# Patient Record
Sex: Female | Born: 1950 | Race: White | Hispanic: No | Marital: Single | Smoking: Never smoker
Health system: Southern US, Community
[De-identification: ages and names within clinical notes are randomized; demographics above are authoritative.]

## PROBLEM LIST (undated history)

## (undated) DIAGNOSIS — M1990 Unspecified osteoarthritis, unspecified site: Secondary | ICD-10-CM

## (undated) DIAGNOSIS — C801 Malignant (primary) neoplasm, unspecified: Secondary | ICD-10-CM

## (undated) DIAGNOSIS — G4700 Insomnia, unspecified: Secondary | ICD-10-CM

## (undated) HISTORY — PX: JOINT REPLACEMENT: SHX530

## (undated) HISTORY — DX: Insomnia, unspecified: G47.00

---

## 2016-01-08 DIAGNOSIS — I872 Venous insufficiency (chronic) (peripheral): Secondary | ICD-10-CM | POA: Diagnosis not present

## 2016-01-08 DIAGNOSIS — N390 Urinary tract infection, site not specified: Secondary | ICD-10-CM | POA: Diagnosis not present

## 2016-01-08 DIAGNOSIS — M79605 Pain in left leg: Secondary | ICD-10-CM | POA: Diagnosis not present

## 2016-01-08 DIAGNOSIS — R9431 Abnormal electrocardiogram [ECG] [EKG]: Secondary | ICD-10-CM | POA: Diagnosis not present

## 2016-01-08 DIAGNOSIS — Z01812 Encounter for preprocedural laboratory examination: Secondary | ICD-10-CM | POA: Diagnosis not present

## 2016-01-08 DIAGNOSIS — I781 Nevus, non-neoplastic: Secondary | ICD-10-CM | POA: Diagnosis not present

## 2016-01-08 DIAGNOSIS — E119 Type 2 diabetes mellitus without complications: Secondary | ICD-10-CM | POA: Diagnosis not present

## 2016-01-08 DIAGNOSIS — E782 Mixed hyperlipidemia: Secondary | ICD-10-CM | POA: Diagnosis not present

## 2016-01-08 DIAGNOSIS — Z7901 Long term (current) use of anticoagulants: Secondary | ICD-10-CM | POA: Diagnosis not present

## 2016-01-08 DIAGNOSIS — M79609 Pain in unspecified limb: Secondary | ICD-10-CM | POA: Diagnosis not present

## 2016-01-08 DIAGNOSIS — M79604 Pain in right leg: Secondary | ICD-10-CM | POA: Diagnosis not present

## 2016-01-09 DIAGNOSIS — D649 Anemia, unspecified: Secondary | ICD-10-CM | POA: Diagnosis not present

## 2016-01-09 DIAGNOSIS — M1990 Unspecified osteoarthritis, unspecified site: Secondary | ICD-10-CM | POA: Diagnosis not present

## 2016-01-09 DIAGNOSIS — F419 Anxiety disorder, unspecified: Secondary | ICD-10-CM | POA: Diagnosis not present

## 2016-01-09 DIAGNOSIS — E7800 Pure hypercholesterolemia, unspecified: Secondary | ICD-10-CM | POA: Diagnosis not present

## 2016-01-27 DIAGNOSIS — N63 Unspecified lump in breast: Secondary | ICD-10-CM | POA: Diagnosis not present

## 2016-01-27 DIAGNOSIS — Z1231 Encounter for screening mammogram for malignant neoplasm of breast: Secondary | ICD-10-CM | POA: Diagnosis not present

## 2016-02-03 DIAGNOSIS — N952 Postmenopausal atrophic vaginitis: Secondary | ICD-10-CM | POA: Diagnosis not present

## 2016-02-03 DIAGNOSIS — Z124 Encounter for screening for malignant neoplasm of cervix: Secondary | ICD-10-CM | POA: Diagnosis not present

## 2016-02-03 DIAGNOSIS — Z1151 Encounter for screening for human papillomavirus (HPV): Secondary | ICD-10-CM | POA: Diagnosis not present

## 2016-02-03 DIAGNOSIS — Z01419 Encounter for gynecological examination (general) (routine) without abnormal findings: Secondary | ICD-10-CM | POA: Diagnosis not present

## 2016-02-04 DIAGNOSIS — M25561 Pain in right knee: Secondary | ICD-10-CM | POA: Diagnosis not present

## 2016-02-04 DIAGNOSIS — M1711 Unilateral primary osteoarthritis, right knee: Secondary | ICD-10-CM | POA: Diagnosis not present

## 2016-02-04 DIAGNOSIS — Z1151 Encounter for screening for human papillomavirus (HPV): Secondary | ICD-10-CM | POA: Diagnosis not present

## 2016-02-04 DIAGNOSIS — M25562 Pain in left knee: Secondary | ICD-10-CM | POA: Diagnosis not present

## 2016-02-04 DIAGNOSIS — Z124 Encounter for screening for malignant neoplasm of cervix: Secondary | ICD-10-CM | POA: Diagnosis not present

## 2016-02-04 DIAGNOSIS — M1712 Unilateral primary osteoarthritis, left knee: Secondary | ICD-10-CM | POA: Diagnosis not present

## 2016-02-24 DIAGNOSIS — B351 Tinea unguium: Secondary | ICD-10-CM | POA: Diagnosis not present

## 2016-02-24 DIAGNOSIS — R262 Difficulty in walking, not elsewhere classified: Secondary | ICD-10-CM | POA: Diagnosis not present

## 2016-03-09 DIAGNOSIS — E7800 Pure hypercholesterolemia, unspecified: Secondary | ICD-10-CM | POA: Diagnosis not present

## 2016-03-09 DIAGNOSIS — F419 Anxiety disorder, unspecified: Secondary | ICD-10-CM | POA: Diagnosis not present

## 2016-03-09 DIAGNOSIS — M1990 Unspecified osteoarthritis, unspecified site: Secondary | ICD-10-CM | POA: Diagnosis not present

## 2016-03-09 DIAGNOSIS — R634 Abnormal weight loss: Secondary | ICD-10-CM | POA: Diagnosis not present

## 2016-03-26 DIAGNOSIS — M545 Low back pain: Secondary | ICD-10-CM | POA: Diagnosis not present

## 2016-03-26 DIAGNOSIS — M9903 Segmental and somatic dysfunction of lumbar region: Secondary | ICD-10-CM | POA: Diagnosis not present

## 2016-03-29 DIAGNOSIS — M545 Low back pain: Secondary | ICD-10-CM | POA: Diagnosis not present

## 2016-03-29 DIAGNOSIS — M9903 Segmental and somatic dysfunction of lumbar region: Secondary | ICD-10-CM | POA: Diagnosis not present

## 2016-03-31 DIAGNOSIS — M9903 Segmental and somatic dysfunction of lumbar region: Secondary | ICD-10-CM | POA: Diagnosis not present

## 2016-03-31 DIAGNOSIS — M545 Low back pain: Secondary | ICD-10-CM | POA: Diagnosis not present

## 2016-04-07 DIAGNOSIS — L821 Other seborrheic keratosis: Secondary | ICD-10-CM | POA: Diagnosis not present

## 2016-04-07 DIAGNOSIS — L820 Inflamed seborrheic keratosis: Secondary | ICD-10-CM | POA: Diagnosis not present

## 2016-04-07 DIAGNOSIS — D0422 Carcinoma in situ of skin of left ear and external auricular canal: Secondary | ICD-10-CM | POA: Diagnosis not present

## 2016-04-07 DIAGNOSIS — L814 Other melanin hyperpigmentation: Secondary | ICD-10-CM | POA: Diagnosis not present

## 2016-04-07 DIAGNOSIS — L570 Actinic keratosis: Secondary | ICD-10-CM | POA: Diagnosis not present

## 2016-04-07 DIAGNOSIS — M9903 Segmental and somatic dysfunction of lumbar region: Secondary | ICD-10-CM | POA: Diagnosis not present

## 2016-04-07 DIAGNOSIS — D485 Neoplasm of uncertain behavior of skin: Secondary | ICD-10-CM | POA: Diagnosis not present

## 2016-04-07 DIAGNOSIS — M545 Low back pain: Secondary | ICD-10-CM | POA: Diagnosis not present

## 2016-04-07 DIAGNOSIS — L578 Other skin changes due to chronic exposure to nonionizing radiation: Secondary | ICD-10-CM | POA: Diagnosis not present

## 2016-04-13 DIAGNOSIS — C44229 Squamous cell carcinoma of skin of left ear and external auricular canal: Secondary | ICD-10-CM | POA: Diagnosis not present

## 2016-04-13 DIAGNOSIS — L821 Other seborrheic keratosis: Secondary | ICD-10-CM | POA: Diagnosis not present

## 2016-04-15 DIAGNOSIS — M545 Low back pain: Secondary | ICD-10-CM | POA: Diagnosis not present

## 2016-04-15 DIAGNOSIS — M9903 Segmental and somatic dysfunction of lumbar region: Secondary | ICD-10-CM | POA: Diagnosis not present

## 2016-05-20 DIAGNOSIS — Z85828 Personal history of other malignant neoplasm of skin: Secondary | ICD-10-CM | POA: Diagnosis not present

## 2016-05-20 DIAGNOSIS — S0080XA Unspecified superficial injury of other part of head, initial encounter: Secondary | ICD-10-CM | POA: Diagnosis not present

## 2016-05-20 DIAGNOSIS — Z08 Encounter for follow-up examination after completed treatment for malignant neoplasm: Secondary | ICD-10-CM | POA: Diagnosis not present

## 2016-05-20 DIAGNOSIS — L905 Scar conditions and fibrosis of skin: Secondary | ICD-10-CM | POA: Diagnosis not present

## 2016-05-26 DIAGNOSIS — E782 Mixed hyperlipidemia: Secondary | ICD-10-CM | POA: Diagnosis not present

## 2016-05-26 DIAGNOSIS — E119 Type 2 diabetes mellitus without complications: Secondary | ICD-10-CM | POA: Diagnosis not present

## 2016-05-29 DIAGNOSIS — Z23 Encounter for immunization: Secondary | ICD-10-CM | POA: Diagnosis not present

## 2016-07-23 DIAGNOSIS — M1990 Unspecified osteoarthritis, unspecified site: Secondary | ICD-10-CM | POA: Diagnosis not present

## 2016-07-23 DIAGNOSIS — R634 Abnormal weight loss: Secondary | ICD-10-CM | POA: Diagnosis not present

## 2016-07-23 DIAGNOSIS — E7800 Pure hypercholesterolemia, unspecified: Secondary | ICD-10-CM | POA: Diagnosis not present

## 2016-07-23 DIAGNOSIS — F419 Anxiety disorder, unspecified: Secondary | ICD-10-CM | POA: Diagnosis not present

## 2016-09-02 DIAGNOSIS — D1723 Benign lipomatous neoplasm of skin and subcutaneous tissue of right leg: Secondary | ICD-10-CM | POA: Diagnosis not present

## 2016-09-02 DIAGNOSIS — C44212 Basal cell carcinoma of skin of right ear and external auricular canal: Secondary | ICD-10-CM | POA: Diagnosis not present

## 2016-09-02 DIAGNOSIS — G501 Atypical facial pain: Secondary | ICD-10-CM | POA: Diagnosis not present

## 2016-09-08 DIAGNOSIS — L723 Sebaceous cyst: Secondary | ICD-10-CM | POA: Diagnosis not present

## 2016-09-08 DIAGNOSIS — G8929 Other chronic pain: Secondary | ICD-10-CM | POA: Diagnosis not present

## 2016-09-08 DIAGNOSIS — L905 Scar conditions and fibrosis of skin: Secondary | ICD-10-CM | POA: Diagnosis not present

## 2016-09-08 DIAGNOSIS — D1723 Benign lipomatous neoplasm of skin and subcutaneous tissue of right leg: Secondary | ICD-10-CM | POA: Diagnosis not present

## 2016-09-08 DIAGNOSIS — L578 Other skin changes due to chronic exposure to nonionizing radiation: Secondary | ICD-10-CM | POA: Diagnosis not present

## 2016-09-08 DIAGNOSIS — D485 Neoplasm of uncertain behavior of skin: Secondary | ICD-10-CM | POA: Diagnosis not present

## 2016-09-08 DIAGNOSIS — Z0000 Encounter for general adult medical examination without abnormal findings: Secondary | ICD-10-CM | POA: Diagnosis not present

## 2016-09-08 DIAGNOSIS — G501 Atypical facial pain: Secondary | ICD-10-CM | POA: Diagnosis not present

## 2016-09-08 DIAGNOSIS — C44212 Basal cell carcinoma of skin of right ear and external auricular canal: Secondary | ICD-10-CM | POA: Diagnosis not present

## 2016-09-29 DIAGNOSIS — D485 Neoplasm of uncertain behavior of skin: Secondary | ICD-10-CM | POA: Diagnosis not present

## 2016-11-09 DIAGNOSIS — H01005 Unspecified blepharitis left lower eyelid: Secondary | ICD-10-CM | POA: Diagnosis not present

## 2016-11-09 DIAGNOSIS — H01004 Unspecified blepharitis left upper eyelid: Secondary | ICD-10-CM | POA: Diagnosis not present

## 2016-11-09 DIAGNOSIS — H524 Presbyopia: Secondary | ICD-10-CM | POA: Diagnosis not present

## 2016-11-09 DIAGNOSIS — H01001 Unspecified blepharitis right upper eyelid: Secondary | ICD-10-CM | POA: Diagnosis not present

## 2016-11-09 DIAGNOSIS — H01002 Unspecified blepharitis right lower eyelid: Secondary | ICD-10-CM | POA: Diagnosis not present

## 2016-12-08 DIAGNOSIS — M1711 Unilateral primary osteoarthritis, right knee: Secondary | ICD-10-CM | POA: Diagnosis not present

## 2016-12-14 DIAGNOSIS — H524 Presbyopia: Secondary | ICD-10-CM | POA: Diagnosis not present

## 2016-12-14 DIAGNOSIS — H01004 Unspecified blepharitis left upper eyelid: Secondary | ICD-10-CM | POA: Diagnosis not present

## 2016-12-14 DIAGNOSIS — H01005 Unspecified blepharitis left lower eyelid: Secondary | ICD-10-CM | POA: Diagnosis not present

## 2016-12-14 DIAGNOSIS — H01002 Unspecified blepharitis right lower eyelid: Secondary | ICD-10-CM | POA: Diagnosis not present

## 2016-12-14 DIAGNOSIS — H01001 Unspecified blepharitis right upper eyelid: Secondary | ICD-10-CM | POA: Diagnosis not present

## 2017-01-21 DIAGNOSIS — M1711 Unilateral primary osteoarthritis, right knee: Secondary | ICD-10-CM | POA: Diagnosis not present

## 2017-02-03 DIAGNOSIS — F419 Anxiety disorder, unspecified: Secondary | ICD-10-CM | POA: Diagnosis not present

## 2017-02-03 DIAGNOSIS — R262 Difficulty in walking, not elsewhere classified: Secondary | ICD-10-CM | POA: Diagnosis not present

## 2017-02-03 DIAGNOSIS — B351 Tinea unguium: Secondary | ICD-10-CM | POA: Diagnosis not present

## 2017-02-03 DIAGNOSIS — M1711 Unilateral primary osteoarthritis, right knee: Secondary | ICD-10-CM | POA: Diagnosis not present

## 2017-02-03 DIAGNOSIS — Z01818 Encounter for other preprocedural examination: Secondary | ICD-10-CM | POA: Diagnosis not present

## 2017-02-03 DIAGNOSIS — M71579 Other bursitis, not elsewhere classified, unspecified ankle and foot: Secondary | ICD-10-CM | POA: Diagnosis not present

## 2017-02-03 DIAGNOSIS — M2040 Other hammer toe(s) (acquired), unspecified foot: Secondary | ICD-10-CM | POA: Diagnosis not present

## 2017-02-07 ENCOUNTER — Ambulatory Visit: Payer: Self-pay | Admitting: Orthopedic Surgery

## 2017-02-08 DIAGNOSIS — M1711 Unilateral primary osteoarthritis, right knee: Secondary | ICD-10-CM | POA: Diagnosis not present

## 2017-02-11 NOTE — Patient Instructions (Addendum)
Veronica Ho  02/11/2017   Your procedure is scheduled on: 02/21/17  Report to [facility] Main  Entrance .  Report to admitting at   0800 AM   Call this number if you have problems the morning of surgery [phone]    Remember: ONLY 1 PERSON MAY GO WITH YOU TO SHORT STAY TO GET  READY MORNING OF YOUR SURGERY.  Do not eat food or drink liquids :After Midnight.     Take these medicines the morning of surgery with A SIP OF WATER: NONE                                You may not have any metal on your body including hair pins and              piercings  Do not wear jewelry, make-up, lotions, powders or perfumes, deodorant             Do not wear nail polish.  Do not shave  48 hours prior to surgery.                Do not bring valuables to the hospital. [place].  Contacts, dentures or bridgework may not be worn into surgery.  Leave suitcase in the car. After surgery it may be brought to your room.                  Please read over the following fact sheets you were given: _____________________________________________________________________           [facility] - Preparing for Surgery Before surgery, you can play an important role.  Because skin is not sterile, your skin needs to be as free of germs as possible.  You can reduce the number of germs on your skin by washing with CHG (chlorahexidine gluconate) soap before surgery.  CHG is an antiseptic cleaner which kills germs and bonds with the skin to continue killing germs even after washing. Please DO NOT use if you have an allergy to CHG or antibacterial soaps.  If your skin becomes reddened/irritated stop using the CHG and inform your nurse when you arrive at Short Stay. Do not shave (including legs and underarms) for at least 48 hours prior to the first CHG shower.  You may shave your face/neck. Please follow these instructions carefully:  1.  Shower  with CHG Soap the night before surgery and the  morning of Surgery.  2.  If you choose to wash your hair, wash your hair first as usual with your  normal  shampoo.  3.  After you shampoo, rinse your hair and body thoroughly to remove the  shampoo.                           4.  Use CHG as you would any other liquid soap.  You can apply chg directly  to the skin and wash                       Gently with a scrungie or clean washcloth.  5.  Apply the CHG Soap to your body ONLY FROM THE NECK DOWN.   Do not  use on face/ open                           Wound or open sores. Avoid contact with eyes, ears mouth and genitals (private parts).                       Wash face,  Genitals (private parts) with your normal soap.             6.  Wash thoroughly, paying special attention to the area where your surgery  will be performed.  7.  Thoroughly rinse your body with warm water from the neck down.  8.  DO NOT shower/wash with your normal soap after using and rinsing off  the CHG Soap.                9.  Pat yourself dry with a clean towel.            10.  Wear clean pajamas.            11.  Place clean sheets on your bed the night of your first shower and do not  sleep with pets. Day of Surgery : Do not apply any lotions/deodorants the morning of surgery.  Please wear clean clothes to the hospital/surgery center.  FAILURE TO FOLLOW THESE INSTRUCTIONS MAY RESULT IN THE CANCELLATION OF YOUR SURGERY PATIENT SIGNATURE_________________________________  NURSE SIGNATURE__________________________________  ________________________________________________________________________  WHAT IS A BLOOD TRANSFUSION? Blood Transfusion Information  A transfusion is the replacement of blood or some of its parts. Blood is made up of multiple cells which provide different functions.  Red blood cells carry oxygen and are used for blood loss replacement.  White blood cells fight against infection.  Platelets control  bleeding.  Plasma helps clot blood.  Other blood products are available for specialized needs, such as hemophilia or other clotting disorders. BEFORE THE TRANSFUSION  Who gives blood for transfusions?   Healthy volunteers who are fully evaluated to make sure their blood is safe. This is blood bank blood. Transfusion therapy is the safest it has ever been in the practice of medicine. Before blood is taken from a donor, a complete history is taken to make sure that person has no history of diseases nor engages in risky social behavior (examples are intravenous drug use or sexual activity with multiple partners). The donor's travel history is screened to minimize risk of transmitting infections, such as malaria. The donated blood is tested for signs of infectious diseases, such as HIV and hepatitis. The blood is then tested to be sure it is compatible with you in order to minimize the chance of a transfusion reaction. If you or a relative donates blood, this is often done in anticipation of surgery and is not appropriate for emergency situations. It takes many days to process the donated blood. RISKS AND COMPLICATIONS Although transfusion therapy is very safe and saves many lives, the main dangers of transfusion include:   Getting an infectious disease.  Developing a transfusion reaction. This is an allergic reaction to something in the blood you were given. Every precaution is taken to prevent this. The decision to have a blood transfusion has been considered carefully by your caregiver before blood is given. Blood is not given unless the benefits outweigh the risks. AFTER THE TRANSFUSION  Right after receiving a blood transfusion, you will usually feel much better and more energetic. This is especially true if  your red blood cells have gotten low (anemic). The transfusion raises the level of the red blood cells which carry oxygen, and this usually causes an energy increase.  The nurse  administering the transfusion will monitor you carefully for complications. HOME CARE INSTRUCTIONS  No special instructions are needed after a transfusion. You may find your energy is better. Speak with your caregiver about any limitations on activity for underlying diseases you may have. SEEK MEDICAL CARE IF:   Your condition is not improving after your transfusion.  You develop redness or irritation at the intravenous (IV) site. SEEK IMMEDIATE MEDICAL CARE IF:  Any of the following symptoms occur over the next 12 hours:  Shaking chills.  You have a temperature by mouth above 102 F (38.9 C), not controlled by medicine.  Chest, back, or muscle pain.  People around you feel you are not acting correctly or are confused.  Shortness of breath or difficulty breathing.  Dizziness and fainting.  You get a rash or develop hives.  You have a decrease in urine output.  Your urine turns a dark color or changes to pink, red, or brown. Any of the following symptoms occur over the next 10 days:  You have a temperature by mouth above 102 F (38.9 C), not controlled by medicine.  Shortness of breath.  Weakness after normal activity.  The white part of the eye turns yellow (jaundice).  You have a decrease in the amount of urine or are urinating less often.  Your urine turns a dark color or changes to pink, red, or brown. Document Released: 07/23/2000 Document Revised: 10/18/2011 Document Reviewed: 03/11/2008 ExitCare Patient Information 2014 [place].  _______________________________________________________________________  Incentive Spirometer  An incentive spirometer is a tool that can help keep your lungs clear and active. This tool measures how well you are filling your lungs with each breath. Taking long deep breaths may help reverse or decrease the chance of developing breathing (pulmonary) problems (especially infection) following:  A long period of time when you are  unable to move or be active. BEFORE THE PROCEDURE   If the spirometer includes an indicator to show your best effort, your nurse or respiratory therapist will set it to a desired goal.  If possible, sit up straight or lean slightly forward. Try not to slouch.  Hold the incentive spirometer in an upright position. INSTRUCTIONS FOR USE  1. Sit on the edge of your bed if possible, or sit up as far as you can in bed or on a chair. 2. Hold the incentive spirometer in an upright position. 3. Breathe out normally. 4. Place the mouthpiece in your mouth and seal your lips tightly around it. 5. Breathe in slowly and as deeply as possible, raising the piston or the ball toward the top of the column. 6. Hold your breath for 3-5 seconds or for as long as possible. Allow the piston or ball to fall to the bottom of the column. 7. Remove the mouthpiece from your mouth and breathe out normally. 8. Rest for a few seconds and repeat Steps 1 through 7 at least 10 times every 1-2 hours when you are awake. Take your time and take a few normal breaths between deep breaths. 9. The spirometer may include an indicator to show your best effort. Use the indicator as a goal to work toward during each repetition. 10. After each set of 10 deep breaths, practice coughing to be sure your lungs are clear. If you have an incision (  the cut made at the time of surgery), support your incision when coughing by placing a pillow or rolled up towels firmly against it. Once you are able to get out of bed, walk around indoors and cough well. You may stop using the incentive spirometer when instructed by your caregiver.  RISKS AND COMPLICATIONS  Take your time so you do not get dizzy or light-headed.  If you are in pain, you may need to take or ask for pain medication before doing incentive spirometry. It is harder to take a deep breath if you are having pain. AFTER USE  Rest and breathe slowly and easily.  It can be helpful to  keep track of a log of your progress. Your caregiver can provide you with a simple table to help with this. If you are using the spirometer at home, follow these instructions: [place] IF:   You are having difficultly using the spirometer.  You have trouble using the spirometer as often as instructed.  Your pain medication is not giving enough relief while using the spirometer.  You develop fever of 100.5 F (38.1 C) or higher. SEEK IMMEDIATE MEDICAL CARE IF:   You cough up bloody sputum that had not been present before.  You develop fever of 102 F (38.9 C) or greater.  You develop worsening pain at or near the incision site. MAKE SURE YOU:   Understand these instructions.  Will watch your condition.  Will get help right away if you are not doing well or get worse. Document Released: 12/06/2006 Document Revised: 10/18/2011 Document Reviewed: 02/06/2007 [facility] Patient Information 2014 [place], [place].   ________________________________________________________________________

## 2017-02-16 ENCOUNTER — Encounter (HOSPITAL_COMMUNITY): Payer: Self-pay

## 2017-02-16 ENCOUNTER — Encounter (HOSPITAL_COMMUNITY)
Admission: RE | Admit: 2017-02-16 | Discharge: 2017-02-16 | Disposition: A | Discharge status: Home / Self Care | Payer: Medicare Other | Source: Ambulatory Visit | Attending: Orthopedic Surgery | Admitting: Orthopedic Surgery

## 2017-02-16 DIAGNOSIS — Z01812 Encounter for preprocedural laboratory examination: Secondary | ICD-10-CM | POA: Diagnosis not present

## 2017-02-16 DIAGNOSIS — Z0183 Encounter for blood typing: Secondary | ICD-10-CM | POA: Diagnosis not present

## 2017-02-16 DIAGNOSIS — M1711 Unilateral primary osteoarthritis, right knee: Secondary | ICD-10-CM | POA: Insufficient documentation

## 2017-02-16 HISTORY — DX: Malignant (primary) neoplasm, unspecified: C80.1

## 2017-02-16 HISTORY — DX: Unspecified osteoarthritis, unspecified site: M19.90

## 2017-02-16 LAB — APTT: aPTT: 43 seconds — ABNORMAL HIGH (ref 24–36)

## 2017-02-16 LAB — CBC
HCT: 40.3 % (ref 36.0–46.0)
HEMOGLOBIN: 13.4 g/dL (ref 12.0–15.0)
MCH: 29.1 pg (ref 26.0–34.0)
MCHC: 33.3 g/dL (ref 30.0–36.0)
MCV: 87.4 fL (ref 78.0–100.0)
PLATELETS: 277 10*3/uL (ref 150–400)
RBC: 4.61 MIL/uL (ref 3.87–5.11)
RDW: 14.1 % (ref 11.5–15.5)
WBC: 7.3 10*3/uL (ref 4.0–10.5)

## 2017-02-16 LAB — COMPREHENSIVE METABOLIC PANEL
ALT: 15 U/L (ref 14–54)
AST: 26 U/L (ref 15–41)
Albumin: 4.5 g/dL (ref 3.5–5.0)
Alkaline Phosphatase: 55 U/L (ref 38–126)
Anion gap: 7 (ref 5–15)
BUN: 11 mg/dL (ref 6–20)
CHLORIDE: 107 mmol/L (ref 101–111)
CO2: 29 mmol/L (ref 22–32)
CREATININE: 0.67 mg/dL (ref 0.44–1.00)
Calcium: 9.2 mg/dL (ref 8.9–10.3)
GFR calc Af Amer: >60 mL/min (ref >60)
GFR calc non Af Amer: >60 mL/min (ref >60)
Glucose, Bld: 104 mg/dL — ABNORMAL HIGH (ref 65–99)
Potassium: 3.8 mmol/L (ref 3.5–5.1)
Sodium: 143 mmol/L (ref 135–145)
Total Bilirubin: 0.5 mg/dL (ref 0.3–1.2)
Total Protein: 7.1 g/dL (ref 6.5–8.1)

## 2017-02-16 LAB — ABO/RH: ABO/RH(D): AB POS

## 2017-02-16 LAB — SURGICAL PCR SCREEN
MRSA, PCR: NEGATIVE
STAPHYLOCOCCUS AUREUS: POSITIVE — AB

## 2017-02-16 LAB — PROTIME-INR
INR: 1.04
Prothrombin Time: 13.7 seconds (ref 11.4–15.2)

## 2017-02-21 ENCOUNTER — Inpatient Hospital Stay (HOSPITAL_COMMUNITY)
Admission: RE | Admit: 2017-02-21 | Discharge: 2017-02-23 | DRG: 470 | Disposition: A | Discharge status: Home / Self Care | Payer: Medicare Other | Source: Ambulatory Visit | Attending: Orthopedic Surgery | Admitting: Orthopedic Surgery

## 2017-02-21 ENCOUNTER — Inpatient Hospital Stay (HOSPITAL_COMMUNITY): Payer: Medicare Other | Admitting: Certified Registered Nurse Anesthetist

## 2017-02-21 ENCOUNTER — Encounter (HOSPITAL_COMMUNITY)
Admission: RE | Disposition: A | Discharge status: Home / Self Care | Payer: Self-pay | Source: Ambulatory Visit | Attending: Orthopedic Surgery

## 2017-02-21 ENCOUNTER — Encounter (HOSPITAL_COMMUNITY): Payer: Self-pay | Admitting: Certified Registered Nurse Anesthetist

## 2017-02-21 DIAGNOSIS — G8918 Other acute postprocedural pain: Secondary | ICD-10-CM | POA: Diagnosis not present

## 2017-02-21 DIAGNOSIS — Z85828 Personal history of other malignant neoplasm of skin: Secondary | ICD-10-CM | POA: Diagnosis not present

## 2017-02-21 DIAGNOSIS — M179 Osteoarthritis of knee, unspecified: Secondary | ICD-10-CM

## 2017-02-21 DIAGNOSIS — Z79899 Other long term (current) drug therapy: Secondary | ICD-10-CM | POA: Diagnosis not present

## 2017-02-21 DIAGNOSIS — M1711 Unilateral primary osteoarthritis, right knee: Secondary | ICD-10-CM | POA: Diagnosis present

## 2017-02-21 DIAGNOSIS — T8482XA Fibrosis due to internal orthopedic prosthetic devices, implants and grafts, initial encounter: Secondary | ICD-10-CM | POA: Diagnosis present

## 2017-02-21 DIAGNOSIS — M1710 Unilateral primary osteoarthritis, unspecified knee: Secondary | ICD-10-CM

## 2017-02-21 HISTORY — PX: TOTAL KNEE ARTHROPLASTY: SHX125

## 2017-02-21 LAB — TYPE AND SCREEN
ABO/RH(D): AB POS
Antibody Screen: NEGATIVE

## 2017-02-21 SURGERY — ARTHROPLASTY, KNEE, TOTAL
Anesthesia: Spinal | Site: Knee | Laterality: Right

## 2017-02-21 MED ORDER — FENTANYL CITRATE (PF) 100 MCG/2ML IJ SOLN
INTRAMUSCULAR | Status: AC
  Administered 2017-02-21: 50 ug via INTRAVENOUS
  Filled 2017-02-21: qty 2

## 2017-02-21 MED ORDER — FLEET ENEMA 7-19 GM/118ML RE ENEM: 1.0000 | ENEMA | Freq: Once | RECTAL | Status: DC | PRN

## 2017-02-21 MED ORDER — PROPOFOL 500 MG/50ML IV EMUL
INTRAVENOUS | Status: DC | PRN
  Administered 2017-02-21: 100 ug/kg/min via INTRAVENOUS

## 2017-02-21 MED ORDER — OXYCODONE HCL 5 MG PO TABS
5.0000 mg | ORAL_TABLET | ORAL | Status: DC | PRN
  Administered 2017-02-21 – 2017-02-23 (×8): 10 mg via ORAL
  Filled 2017-02-21 (×8): qty 2

## 2017-02-21 MED ORDER — MIDAZOLAM HCL 2 MG/2ML IJ SOLN
2.0000 mg | Freq: Once | INTRAMUSCULAR | Status: AC
  Administered 2017-02-21: 2 mg via INTRAVENOUS

## 2017-02-21 MED ORDER — MENTHOL 3 MG MT LOZG: 1.0000 | LOZENGE | OROMUCOSAL | Status: DC | PRN

## 2017-02-21 MED ORDER — GABAPENTIN 300 MG PO CAPS
300.0000 mg | ORAL_CAPSULE | Freq: Once | ORAL | Status: AC
  Administered 2017-02-21: 300 mg via ORAL

## 2017-02-21 MED ORDER — ONDANSETRON HCL 4 MG/2ML IJ SOLN
INTRAMUSCULAR | Status: AC
  Filled 2017-02-21: qty 2

## 2017-02-21 MED ORDER — FENTANYL CITRATE (PF) 100 MCG/2ML IJ SOLN
INTRAMUSCULAR | Status: AC
  Filled 2017-02-21: qty 2

## 2017-02-21 MED ORDER — ONDANSETRON HCL 4 MG PO TABS: 4.0000 mg | ORAL_TABLET | Freq: Four times a day (QID) | ORAL | Status: DC | PRN

## 2017-02-21 MED ORDER — ONDANSETRON HCL 4 MG/2ML IJ SOLN
4.0000 mg | Freq: Once | INTRAMUSCULAR | Status: DC | PRN
Start: 2017-02-21 — End: 2017-02-21

## 2017-02-21 MED ORDER — MEPERIDINE HCL 50 MG/ML IJ SOLN: 6.2500 mg | INTRAMUSCULAR | Status: DC | PRN

## 2017-02-21 MED ORDER — ACETAMINOPHEN 10 MG/ML IV SOLN
INTRAVENOUS | Status: AC
  Filled 2017-02-21: qty 100

## 2017-02-21 MED ORDER — DEXTROSE 5 % IV SOLN
500.0000 mg | Freq: Four times a day (QID) | INTRAVENOUS | Status: DC | PRN
  Administered 2017-02-21: 500 mg via INTRAVENOUS
  Filled 2017-02-21: qty 550

## 2017-02-21 MED ORDER — FENTANYL CITRATE (PF) 100 MCG/2ML IJ SOLN
100.0000 ug | Freq: Once | INTRAMUSCULAR | Status: AC
  Administered 2017-02-21: 50 ug via INTRAVENOUS

## 2017-02-21 MED ORDER — CHLORHEXIDINE GLUCONATE 4 % EX LIQD: 60.0000 mL | Freq: Once | CUTANEOUS | Status: DC

## 2017-02-21 MED ORDER — PHENOL 1.4 % MT LIQD: 1.0000 | OROMUCOSAL | Status: DC | PRN

## 2017-02-21 MED ORDER — PROPOFOL 10 MG/ML IV BOLUS
INTRAVENOUS | Status: AC
  Filled 2017-02-21: qty 40

## 2017-02-21 MED ORDER — DEXAMETHASONE SODIUM PHOSPHATE 10 MG/ML IJ SOLN: 10.0000 mg | Freq: Once | INTRAMUSCULAR | Status: DC

## 2017-02-21 MED ORDER — HYDROMORPHONE HCL-NACL 0.5-0.9 MG/ML-% IV SOSY
PREFILLED_SYRINGE | INTRAVENOUS | Status: AC
  Filled 2017-02-21: qty 1

## 2017-02-21 MED ORDER — ONDANSETRON HCL 4 MG/2ML IJ SOLN
4.0000 mg | Freq: Four times a day (QID) | INTRAMUSCULAR | Status: DC | PRN
  Administered 2017-02-21: 4 mg via INTRAVENOUS
  Filled 2017-02-21: qty 2

## 2017-02-21 MED ORDER — TRANEXAMIC ACID 1000 MG/10ML IV SOLN
1000.0000 mg | INTRAVENOUS | Status: AC
  Administered 2017-02-21: 1000 mg via INTRAVENOUS
  Filled 2017-02-21: qty 1100

## 2017-02-21 MED ORDER — ONDANSETRON HCL 4 MG/2ML IJ SOLN
INTRAMUSCULAR | Status: DC | PRN
  Administered 2017-02-21: 4 mg via INTRAVENOUS

## 2017-02-21 MED ORDER — ACETAMINOPHEN 650 MG RE SUPP: 650.0000 mg | Freq: Four times a day (QID) | RECTAL | Status: DC | PRN

## 2017-02-21 MED ORDER — ALPRAZOLAM 0.25 MG PO TABS
0.2500 mg | ORAL_TABLET | Freq: Every day | ORAL | Status: DC
  Administered 2017-02-21: 0.25 mg via ORAL
  Administered 2017-02-22: 23:00:00 0.5 mg via ORAL
  Administered 2017-02-22: 01:00:00 0.25 mg via ORAL
  Filled 2017-02-21: qty 1
  Filled 2017-02-21: qty 2
  Filled 2017-02-21: qty 1

## 2017-02-21 MED ORDER — METHOCARBAMOL 500 MG PO TABS
500.0000 mg | ORAL_TABLET | Freq: Four times a day (QID) | ORAL | Status: DC | PRN
  Administered 2017-02-21 – 2017-02-23 (×5): 500 mg via ORAL
  Filled 2017-02-21 (×5): qty 1

## 2017-02-21 MED ORDER — DOCUSATE SODIUM 100 MG PO CAPS
100.0000 mg | ORAL_CAPSULE | Freq: Two times a day (BID) | ORAL | Status: DC
  Administered 2017-02-21 – 2017-02-23 (×4): 100 mg via ORAL
  Filled 2017-02-21 (×4): qty 1

## 2017-02-21 MED ORDER — FENTANYL CITRATE (PF) 100 MCG/2ML IJ SOLN
INTRAMUSCULAR | Status: DC | PRN
  Administered 2017-02-21 (×2): 50 ug via INTRAVENOUS

## 2017-02-21 MED ORDER — PROPOFOL 10 MG/ML IV BOLUS
INTRAVENOUS | Status: AC
  Filled 2017-02-21: qty 20

## 2017-02-21 MED ORDER — SODIUM CHLORIDE 0.9 % IV SOLN
INTRAVENOUS | Status: DC
  Administered 2017-02-21 – 2017-02-22 (×2): via INTRAVENOUS

## 2017-02-21 MED ORDER — DEXAMETHASONE SODIUM PHOSPHATE 10 MG/ML IJ SOLN
INTRAMUSCULAR | Status: DC | PRN
  Administered 2017-02-21: 10 mg via INTRAVENOUS

## 2017-02-21 MED ORDER — MORPHINE SULFATE (PF) 4 MG/ML IV SOLN: 1.0000 mg | INTRAVENOUS | Status: DC | PRN

## 2017-02-21 MED ORDER — PHENYLEPHRINE 40 MCG/ML (10ML) SYRINGE FOR IV PUSH (FOR BLOOD PRESSURE SUPPORT)
PREFILLED_SYRINGE | INTRAVENOUS | Status: AC
  Filled 2017-02-21: qty 10

## 2017-02-21 MED ORDER — BISACODYL 10 MG RE SUPP: 10.0000 mg | Freq: Every day | RECTAL | Status: DC | PRN

## 2017-02-21 MED ORDER — GABAPENTIN 300 MG PO CAPS
ORAL_CAPSULE | ORAL | Status: AC
  Administered 2017-02-21: 300 mg via ORAL
  Filled 2017-02-21: qty 1

## 2017-02-21 MED ORDER — MIDAZOLAM HCL 5 MG/5ML IJ SOLN
INTRAMUSCULAR | Status: DC | PRN
  Administered 2017-02-21 (×2): 1 mg via INTRAVENOUS

## 2017-02-21 MED ORDER — LACTATED RINGERS IV SOLN
INTRAVENOUS | Status: DC | PRN
  Administered 2017-02-21 (×2): via INTRAVENOUS

## 2017-02-21 MED ORDER — BUPIVACAINE LIPOSOME 1.3 % IJ SUSP
20.0000 mL | Freq: Once | INTRAMUSCULAR | Status: DC
  Filled 2017-02-21: qty 20

## 2017-02-21 MED ORDER — TRAMADOL HCL 50 MG PO TABS
50.0000 mg | ORAL_TABLET | Freq: Four times a day (QID) | ORAL | Status: DC | PRN
  Administered 2017-02-22 – 2017-02-23 (×2): 100 mg via ORAL
  Filled 2017-02-21 (×2): qty 2

## 2017-02-21 MED ORDER — ACETAMINOPHEN 10 MG/ML IV SOLN
1000.0000 mg | Freq: Once | INTRAVENOUS | Status: AC
  Administered 2017-02-21: 1000 mg via INTRAVENOUS

## 2017-02-21 MED ORDER — MIDAZOLAM HCL 2 MG/2ML IJ SOLN
INTRAMUSCULAR | Status: AC
  Administered 2017-02-21: 2 mg via INTRAVENOUS
  Filled 2017-02-21: qty 2

## 2017-02-21 MED ORDER — SODIUM CHLORIDE 0.9 % IJ SOLN
INTRAMUSCULAR | Status: AC
Start: 2017-02-21 — End: 2017-02-21
  Filled 2017-02-21: qty 10

## 2017-02-21 MED ORDER — MIDAZOLAM HCL 2 MG/2ML IJ SOLN
INTRAMUSCULAR | Status: AC
  Filled 2017-02-21: qty 2

## 2017-02-21 MED ORDER — ACETAMINOPHEN 500 MG PO TABS
1000.0000 mg | ORAL_TABLET | Freq: Four times a day (QID) | ORAL | Status: AC
  Administered 2017-02-21 – 2017-02-22 (×4): 1000 mg via ORAL
  Filled 2017-02-21 (×4): qty 2

## 2017-02-21 MED ORDER — RIVAROXABAN 10 MG PO TABS
10.0000 mg | ORAL_TABLET | Freq: Every day | ORAL | Status: DC
  Administered 2017-02-22 – 2017-02-23 (×2): 10 mg via ORAL
  Filled 2017-02-21 (×2): qty 1

## 2017-02-21 MED ORDER — ACETAMINOPHEN 325 MG PO TABS
650.0000 mg | ORAL_TABLET | Freq: Four times a day (QID) | ORAL | Status: DC | PRN
  Administered 2017-02-22: 650 mg via ORAL
  Filled 2017-02-21: qty 2

## 2017-02-21 MED ORDER — DEXAMETHASONE SODIUM PHOSPHATE 10 MG/ML IJ SOLN
10.0000 mg | Freq: Once | INTRAMUSCULAR | Status: AC
  Administered 2017-02-22: 10 mg via INTRAVENOUS
  Filled 2017-02-21: qty 1

## 2017-02-21 MED ORDER — LACTATED RINGERS IV SOLN: INTRAVENOUS | Status: DC

## 2017-02-21 MED ORDER — BUPIVACAINE LIPOSOME 1.3 % IJ SUSP
INTRAMUSCULAR | Status: DC | PRN
  Administered 2017-02-21: 20 mL

## 2017-02-21 MED ORDER — BUPIVACAINE-EPINEPHRINE (PF) 0.5% -1:200000 IJ SOLN
INTRAMUSCULAR | Status: DC | PRN
  Administered 2017-02-21: 30 mL via PERINEURAL

## 2017-02-21 MED ORDER — CEFAZOLIN SODIUM-DEXTROSE 2-4 GM/100ML-% IV SOLN
INTRAVENOUS | Status: AC
  Filled 2017-02-21: qty 100

## 2017-02-21 MED ORDER — CEFAZOLIN SODIUM-DEXTROSE 2-4 GM/100ML-% IV SOLN
2.0000 g | INTRAVENOUS | Status: AC
  Administered 2017-02-21: 2 g via INTRAVENOUS

## 2017-02-21 MED ORDER — DIPHENHYDRAMINE HCL 12.5 MG/5ML PO ELIX: 12.5000 mg | ORAL_SOLUTION | ORAL | Status: DC | PRN

## 2017-02-21 MED ORDER — METOCLOPRAMIDE HCL 5 MG PO TABS: 5.0000 mg | ORAL_TABLET | Freq: Three times a day (TID) | ORAL | Status: DC | PRN

## 2017-02-21 MED ORDER — BUPIVACAINE IN DEXTROSE 0.75-8.25 % IT SOLN
INTRATHECAL | Status: DC | PRN
  Administered 2017-02-21: 1.8 mL via INTRATHECAL

## 2017-02-21 MED ORDER — CEFAZOLIN SODIUM-DEXTROSE 1-4 GM/50ML-% IV SOLN
1.0000 g | Freq: Four times a day (QID) | INTRAVENOUS | Status: AC
  Administered 2017-02-21 (×2): 1 g via INTRAVENOUS
  Filled 2017-02-21 (×2): qty 50

## 2017-02-21 MED ORDER — HYDROMORPHONE HCL-NACL 0.5-0.9 MG/ML-% IV SOSY
0.2500 mg | PREFILLED_SYRINGE | INTRAVENOUS | Status: DC | PRN
  Administered 2017-02-21: 0.25 mg via INTRAVENOUS

## 2017-02-21 MED ORDER — TRANEXAMIC ACID 1000 MG/10ML IV SOLN
1000.0000 mg | Freq: Once | INTRAVENOUS | Status: AC
  Administered 2017-02-21: 16:00:00 1000 mg via INTRAVENOUS
  Filled 2017-02-21: qty 1100

## 2017-02-21 MED ORDER — DEXAMETHASONE SODIUM PHOSPHATE 10 MG/ML IJ SOLN
INTRAMUSCULAR | Status: AC
  Filled 2017-02-21: qty 1

## 2017-02-21 MED ORDER — PHENYLEPHRINE HCL 10 MG/ML IJ SOLN
INTRAMUSCULAR | Status: DC | PRN
  Administered 2017-02-21 (×5): 40 ug via INTRAVENOUS
  Administered 2017-02-21: 80 ug via INTRAVENOUS
  Administered 2017-02-21 (×6): 40 ug via INTRAVENOUS

## 2017-02-21 MED ORDER — METOCLOPRAMIDE HCL 5 MG/ML IJ SOLN
5.0000 mg | Freq: Three times a day (TID) | INTRAMUSCULAR | Status: DC | PRN
  Filled 2017-02-21: qty 2

## 2017-02-21 MED ORDER — POLYETHYLENE GLYCOL 3350 17 G PO PACK: 17.0000 g | PACK | Freq: Every day | ORAL | Status: DC | PRN

## 2017-02-21 MED ORDER — SODIUM CHLORIDE 0.9 % IJ SOLN
INTRAMUSCULAR | Status: DC | PRN
  Administered 2017-02-21: 60 mL

## 2017-02-21 MED ORDER — SODIUM CHLORIDE 0.9 % IJ SOLN
INTRAMUSCULAR | Status: AC
Start: 2017-02-21 — End: 2017-02-21
  Filled 2017-02-21: qty 50

## 2017-02-21 SURGICAL SUPPLY — 52 items
BAG DECANTER FOR FLEXI CONT (MISCELLANEOUS) ×3 IMPLANT
BAG ZIPLOCK 12X15 (MISCELLANEOUS) ×3 IMPLANT
BANDAGE ACE 6X5 VEL STRL LF (GAUZE/BANDAGES/DRESSINGS) ×3 IMPLANT
BANDAGE ELASTIC 6 VELCRO ST LF (GAUZE/BANDAGES/DRESSINGS) ×3 IMPLANT
BLADE SAG 18X100X1.27 (BLADE) ×3 IMPLANT
BLADE SAW SGTL 11.0X1.19X90.0M (BLADE) ×3 IMPLANT
BOWL SMART MIX CTS (DISPOSABLE) ×3 IMPLANT
CAPT KNEE TOTAL 3 ATTUNE ×3 IMPLANT
CEMENT HV SMART SET (Cement) ×6 IMPLANT
CLOSURE WOUND 1/2 X4 (GAUZE/BANDAGES/DRESSINGS) ×1
COVER SURGICAL LIGHT HANDLE (MISCELLANEOUS) ×3 IMPLANT
CUFF TOURN SGL QUICK 34 (TOURNIQUET CUFF) ×2
CUFF TRNQT CYL 34X4X40X1 (TOURNIQUET CUFF) ×1 IMPLANT
DECANTER SPIKE VIAL GLASS SM (MISCELLANEOUS) ×3 IMPLANT
DRAPE U-SHAPE 47X51 STRL (DRAPES) ×3 IMPLANT
DRSG ADAPTIC 3X8 NADH LF (GAUZE/BANDAGES/DRESSINGS) ×3 IMPLANT
DRSG PAD ABDOMINAL 8X10 ST (GAUZE/BANDAGES/DRESSINGS) ×3 IMPLANT
DURAPREP 26ML APPLICATOR (WOUND CARE) ×3 IMPLANT
ELECT REM PT RETURN 15FT ADLT (MISCELLANEOUS) ×3 IMPLANT
EVACUATOR 1/8 PVC DRAIN (DRAIN) ×3 IMPLANT
GAUZE SPONGE 4X4 12PLY STRL (GAUZE/BANDAGES/DRESSINGS) ×3 IMPLANT
GLOVE BIO SURGEON STRL SZ7.5 (GLOVE) IMPLANT
GLOVE BIO SURGEON STRL SZ8 (GLOVE) ×3 IMPLANT
GLOVE BIOGEL PI IND STRL 6.5 (GLOVE) IMPLANT
GLOVE BIOGEL PI IND STRL 8 (GLOVE) ×1 IMPLANT
GLOVE BIOGEL PI INDICATOR 6.5 (GLOVE)
GLOVE BIOGEL PI INDICATOR 8 (GLOVE) ×2
GLOVE SURG SS PI 6.5 STRL IVOR (GLOVE) IMPLANT
GOWN STRL REUS W/TWL LRG LVL3 (GOWN DISPOSABLE) ×3 IMPLANT
GOWN STRL REUS W/TWL XL LVL3 (GOWN DISPOSABLE) IMPLANT
HANDPIECE INTERPULSE COAX TIP (DISPOSABLE) ×2
IMMOBILIZER KNEE 20 (SOFTGOODS) ×6 IMPLANT
IMMOBILIZER KNEE 20 THIGH 36 (SOFTGOODS) ×1 IMPLANT
MANIFOLD NEPTUNE II (INSTRUMENTS) ×3 IMPLANT
NS IRRIG 1000ML POUR BTL (IV SOLUTION) ×3 IMPLANT
PACK TOTAL KNEE CUSTOM (KITS) ×3 IMPLANT
PAD ABD 7.5X8 STRL (GAUZE/BANDAGES/DRESSINGS) ×3 IMPLANT
PADDING CAST COTTON 6X4 STRL (CAST SUPPLIES) ×3 IMPLANT
POSITIONER SURGICAL ARM (MISCELLANEOUS) ×3 IMPLANT
SET HNDPC FAN SPRY TIP SCT (DISPOSABLE) ×1 IMPLANT
STRIP CLOSURE SKIN 1/2X4 (GAUZE/BANDAGES/DRESSINGS) ×2 IMPLANT
SUT MNCRL AB 4-0 PS2 18 (SUTURE) ×3 IMPLANT
SUT STRATAFIX 0 PDS 27 VIOLET (SUTURE) ×3
SUT VIC AB 2-0 CT1 27 (SUTURE) ×6
SUT VIC AB 2-0 CT1 TAPERPNT 27 (SUTURE) ×3 IMPLANT
SUTURE STRATFX 0 PDS 27 VIOLET (SUTURE) ×1 IMPLANT
SYR 30ML LL (SYRINGE) ×6 IMPLANT
TRAY FOLEY W/METER SILVER 14FR (SET/KITS/TRAYS/PACK) ×3 IMPLANT
TRAY FOLEY W/METER SILVER 16FR (SET/KITS/TRAYS/PACK) IMPLANT
WATER STERILE IRR 1000ML POUR (IV SOLUTION) ×6 IMPLANT
WRAP KNEE MAXI GEL POST OP (GAUZE/BANDAGES/DRESSINGS) ×3 IMPLANT
YANKAUER SUCT BULB TIP 10FT TU (MISCELLANEOUS) ×3 IMPLANT

## 2017-02-21 NOTE — Anesthesia Preprocedure Evaluation (Signed)
Anesthesia Evaluation  Patient identified by MRN, date of birth, ID band Patient awake    Reviewed: Allergy & Precautions, NPO status , Patient's Chart, lab work & pertinent test results  Airway Mallampati: I  TM Distance: >3 FB Neck ROM: Full    Dental   Pulmonary    Pulmonary exam normal        Cardiovascular Normal cardiovascular exam     Neuro/Psych    GI/Hepatic   Endo/Other    Renal/GU      Musculoskeletal   Abdominal   Peds  Hematology   Anesthesia Other Findings   Reproductive/Obstetrics                             Anesthesia Physical Anesthesia Plan  ASA: II  Anesthesia Plan: Spinal   Post-op Pain Management:    Induction: Intravenous  PONV Risk Score and Plan: 2 and Ondansetron and Dexamethasone  Airway Management Planned:   Additional Equipment:   Intra-op Plan:   Post-operative Plan:   Informed Consent: I have reviewed the patients History and Physical, chart, labs and discussed the procedure including the risks, benefits and alternatives for the proposed anesthesia with the patient or authorized representative who has indicated his/her understanding and acceptance.     Plan Discussed with: CRNA and Surgeon  Anesthesia Plan Comments:         Anesthesia Quick Evaluation

## 2017-02-21 NOTE — Transfer of Care (Signed)
Immediate Anesthesia Transfer of Care Note  Patient: Veronica Ho  Procedure(s) Performed: Procedure(s) with comments: RIGHT TOTAL KNEE ARTHROPLASTY (Right) - With adductor canal block.   Patient Location: PACU  Anesthesia Type:Spinal and MAC combined with regional for post-op pain  Level of Consciousness:  sedated, patient cooperative and responds to stimulation  Airway & Oxygen Therapy:Patient Spontanous Breathing and Patient connected to face mask oxgen  Post-op Assessment:  Report given to PACU RN and Post -op Vital signs reviewed and stable  Post vital signs:  Reviewed and stable  Last Vitals:  Vitals:   02/21/17 0932 02/21/17 0945  BP:  132/71  Pulse: (!) 57 60  Resp: 12 15  Temp:      Complications: No apparent anesthesia complications

## 2017-02-21 NOTE — Anesthesia Procedure Notes (Addendum)
Anesthesia Regional Block: Adductor canal block   Pre-Anesthetic Checklist: ,, timeout performed, Correct Patient, Correct Site, Correct Laterality, Correct Procedure, Correct Position, site marked, Risks and benefits discussed,  Surgical consent,  Pre-op evaluation,  At surgeon's request and post-op pain management  Laterality: Left  Prep: chloraprep       Needles:  Injection technique: Single-shot  Needle Type: Echogenic Stimulator Needle     Needle Length: 9cm  Needle Gauge: 21     Additional Needles:   Procedures: ultrasound guided,,,,,,,,  Narrative:  Start time: 02/21/2017 9:20 AM End time: 02/21/2017 9:30 AM Injection made incrementally with aspirations every 5 mL.  Performed by: Personally  Anesthesiologist: Lillia Abed  Additional Notes: Monitors applied. Patient sedated. Sterile prep and drape,hand hygiene and sterile gloves were used. Relevant anatomy identified.Needle position confirmed.Local anesthetic injected incrementally after negative aspiration. Local anesthetic spread visualized around nerve(s). Vascular puncture avoided. No complications. Image printed for medical record.The patient tolerated the procedure well.    Lillia Abed MD

## 2017-02-21 NOTE — Op Note (Signed)
OPERATIVE REPORT-TOTAL KNEE ARTHROPLASTY   Pre-operative diagnosis- Osteoarthritis  Right knee(s)  Post-operative diagnosis- Osteoarthritis Right knee(s)   Procedure-  Right  Total Knee Arthroplasty (Depuy Attune)  Surgeon- Dione Plover. Nandika Stetzer, MD  Assistant- Arlee Muslim, PA-C   Anesthesia-  Adductor canal block and spinal  EBL-* No blood loss amount entered *   Drains Hemovac  Tourniquet time-  Total Tourniquet Time Documented: Thigh (Right) - 33 minutes Total: Thigh (Right) - 33 minutes     Complications- None  Condition-PACU - hemodynamically stable.   Brief Clinical Note  Veronica Ho is a 66 y.o. year old female with end stage OA of her right knee with progressively worsening pain and dysfunction. She has constant pain, with activity and at rest and significant functional deficits with difficulties even with ADLs. She has had extensive non-op management including analgesics, injections of cortisone, and home exercise program, but remains in significant pain with significant dysfunction.Radiographs show bone on bone arthritis medial and patellofemoral with significant varus deformity. She presents now for right Total Knee Arthroplasty.    Procedure in detail---   The patient is brought into the operating room and positioned supine on the operating table. After successful administration of  Adductor canal block and spinal,   a tourniquet is placed high on the  Right thigh(s) and the lower extremity is prepped and draped in the usual sterile fashion. Time out is performed by the operating team and then the  Right lower extremity is wrapped in Esmarch, knee flexed and the tourniquet inflated to 300 mmHg.       A midline incision is made with a ten blade through the subcutaneous tissue to the level of the extensor mechanism. A fresh blade is used to make a medial parapatellar arthrotomy. Soft tissue over the proximal medial tibia is subperiosteally elevated to the joint line  with a knife and into the semimembranosus bursa with a Cobb elevator. Soft tissue over the proximal lateral tibia is elevated with attention being paid to avoiding the patellar tendon on the tibial tubercle. The patella is everted, knee flexed 90 degrees and the ACL and PCL are removed. Findings are bone on bone medial and patellofemoral with large medial osteophytes.        The drill is used to create a starting hole in the distal femur and the canal is thoroughly irrigated with sterile saline to remove the fatty contents. The 5 degree Right  valgus alignment guide is placed into the femoral canal and the distal femoral cutting block is pinned to remove 9 mm off the distal femur. Resection is made with an oscillating saw.      The tibia is subluxed forward and the menisci are removed. The extramedullary alignment guide is placed referencing proximally at the medial aspect of the tibial tubercle and distally along the second metatarsal axis and tibial crest. The block is pinned to remove 25mm off the more deficient medial  side. Resection is made with an oscillating saw. Size 4is the most appropriate size for the tibia and the proximal tibia is prepared with the modular drill and keel punch for that size.      The femoral sizing guide is placed and size 4 is most appropriate. Rotation is marked off the epicondylar axis and confirmed by creating a rectangular flexion gap at 90 degrees. The size 4 cutting block is pinned in this rotation and the anterior, posterior and chamfer cuts are made with the oscillating saw. The intercondylar block is  then placed and that cut is made.      Trial size 4 tibial component, trial size 4 posterior stabilized femur and a 7  mm posterior stabilized rotating platform insert trial is placed. Full extension is achieved with excellent varus/valgus and anterior/posterior balance throughout full range of motion. The patella is everted and thickness measured to be 21  mm. Free hand  resection is taken to 12 mm, a 35 template is placed, lug holes are drilled, trial patella is placed, and it tracks normally. Osteophytes are removed off the posterior femur with the trial in place. All trials are removed and the cut bone surfaces prepared with pulsatile lavage. Cement is mixed and once ready for implantation, the size 4 tibial implant, size  4 posterior stabilized femoral component, and the size 35 patella are cemented in place and the patella is held with the clamp. The trial insert is placed and the knee held in full extension. The Exparel (20 ml mixed with 60 ml saline) is injected into the extensor mechanism, posterior capsule, medial and lateral gutters and subcutaneous tissues.  All extruded cement is removed and once the cement is hard the permanent 7 mm posterior stabilized rotating platform insert is placed into the tibial tray.      The wound is copiously irrigated with saline solution and the extensor mechanism closed over a hemovac drain with #1 V-loc suture. The tourniquet is released for a total tourniquet time of 33  minutes. Flexion against gravity is 140 degrees and the patella tracks normally. Subcutaneous tissue is closed with 2.0 vicryl and subcuticular with running 4.0 Monocryl. The incision is cleaned and dried and steri-strips and a bulky sterile dressing are applied. The limb is placed into a knee immobilizer and the patient is awakened and transported to recovery in stable condition.      Please note that a surgical assistant was a medical necessity for this procedure in order to perform it in a safe and expeditious manner. Surgical assistant was necessary to retract the ligaments and vital neurovascular structures to prevent injury to them and also necessary for proper positioning of the limb to allow for anatomic placement of the prosthesis.   Dione Plover Adarryl Goldammer, MD    02/21/2017, 11:20 AM

## 2017-02-21 NOTE — Anesthesia Preprocedure Evaluation (Signed)
Anesthesia Evaluation Anesthesia Physical Anesthesia Plan Anesthesia Quick Evaluation  

## 2017-02-21 NOTE — Interval H&P Note (Signed)
History and Physical Interval Note:  02/21/2017 9:27 AM  Veronica Ho  has presented today for surgery, with the diagnosis of Osteoarthritis Right knee  The various methods of treatment have been discussed with the patient and family. After consideration of risks, benefits and other options for treatment, the patient has consented to  Procedure(s): RIGHT TOTAL KNEE ARTHROPLASTY (Right) as a surgical intervention .  The patient's history has been reviewed, patient examined, no change in status, stable for surgery.  I have reviewed the patient's chart and labs.  Questions were answered to the patient's satisfaction.     [name]

## 2017-02-21 NOTE — Discharge Instructions (Addendum)
° °Dr. Newman Waren °Total Joint Specialist °[facility] °[address] °, [place] [address] °[phone] ° °TOTAL KNEE REPLACEMENT POSTOPERATIVE DIRECTIONS ° °Knee Rehabilitation, Guidelines Following Surgery  °Results after knee surgery are often greatly improved when you follow the exercise, range of motion and muscle strengthening exercises prescribed by your doctor. Safety measures are also important to protect the knee from further injury. Any time any of these exercises cause you to have increased pain or swelling in your knee joint, decrease the amount until you are comfortable again and slowly increase them. If you have problems or questions, call your caregiver or physical therapist for advice.  ° °HOME CARE INSTRUCTIONS  °Remove items at home which could result in a fall. This includes throw rugs or furniture in walking pathways.  °· ICE to the affected knee every three hours for 30 minutes at a time and then as needed for pain and swelling.  Continue to use ice on the knee for pain and swelling from surgery. You may notice swelling that will progress down to the foot and ankle.  This is normal after surgery.  Elevate the leg when you are not up walking on it.   °· Continue to use the breathing machine which will help keep your temperature down.  It is common for your temperature to cycle up and down following surgery, especially at night when you are not up moving around and exerting yourself.  The breathing machine keeps your lungs expanded and your temperature down. °· Do not place pillow under knee, focus on keeping the knee straight while resting ° °DIET °You may resume your previous home diet once your are discharged from the hospital. ° °DRESSING / WOUND CARE / SHOWERING °You may start showering once you are discharged home but do not submerge the incision under water. Just pat the incision dry and apply a dry gauze dressing on daily. °Change the surgical dressing  daily and reapply a dry dressing each time. ° °ACTIVITY °Walk with your walker as instructed. °Use walker as long as suggested by your caregivers. °Avoid periods of inactivity such as sitting longer than an hour when not asleep. This helps prevent blood clots.  °You may resume a sexual relationship in one month or when given the OK by your doctor.  °You may return to work once you are cleared by your doctor.  °Do not drive a car for 6 weeks or until released by you surgeon.  °Do not drive while taking narcotics. ° °WEIGHT BEARING °Weight bearing as tolerated with assist device (walker, cane, etc) as directed, use it as long as suggested by your surgeon or therapist, typically at least 4-6 weeks. ° °POSTOPERATIVE CONSTIPATION PROTOCOL °Constipation - defined medically as fewer than three stools per week and severe constipation as less than one stool per week. ° °One of the most common issues patients have following surgery is constipation.  Even if you have a regular bowel pattern at home, your normal regimen is likely to be disrupted due to multiple reasons following surgery.  Combination of anesthesia, postoperative narcotics, change in appetite and fluid intake all can affect your bowels.  In order to avoid complications following surgery, here are some recommendations in order to help you during your recovery period. ° °Colace (docusate) - Pick up an over-the-counter form of Colace or another stool softener and take twice a day as long as you are requiring postoperative pain medications.  Take with a full glass of water   daily.  If you experience loose stools or diarrhea, hold the colace until you stool forms back up.  If your symptoms do not get better within 1 week or if they get worse, check with your doctor. ° °Dulcolax (bisacodyl) - Pick up over-the-counter and take as directed by the product packaging as needed to assist with the movement of your bowels.  Take with a full glass of water.  Use this product as  needed if not relieved by Colace only.  ° °MiraLax (polyethylene glycol) - Pick up over-the-counter to have on hand.  MiraLax is a solution that will increase the amount of water in your bowels to assist with bowel movements.  Take as directed and can mix with a glass of water, juice, soda, coffee, or tea.  Take if you go more than two days without a movement. °Do not use MiraLax more than once per day. Call your doctor if you are still constipated or irregular after using this medication for 7 days in a row. ° °If you continue to have problems with postoperative constipation, please contact the office for further assistance and recommendations.  If you experience "the worst abdominal pain ever" or develop nausea or vomiting, please contact the office immediatly for further recommendations for treatment. ° °ITCHING ° If you experience itching with your medications, try taking only a single pain pill, or even half a pain pill at a time.  You can also use Benadryl over the counter for itching or also to help with sleep.  ° °TED HOSE STOCKINGS °Wear the elastic stockings on both legs for three weeks following surgery during the day but you may remove then at night for sleeping. ° °MEDICATIONS °See your medication summary on the “After Visit Summary” that the nursing staff will review with you prior to discharge.  You may have some home medications which will be placed on hold until you complete the course of blood thinner medication.  It is important for you to complete the blood thinner medication as prescribed by your surgeon.  Continue your approved medications as instructed at time of discharge. ° °PRECAUTIONS °If you experience chest pain or shortness of breath - call 911 immediately for transfer to the hospital emergency department.  °If you develop a fever greater that 101 F, purulent drainage from wound, increased redness or drainage from wound, foul odor from the wound/dressing, or calf pain - CONTACT YOUR  SURGEON.   °                                                °FOLLOW-UP APPOINTMENTS °Make sure you keep all of your appointments after your operation with your surgeon and caregivers. You should call the office at the above phone number and make an appointment for approximately two weeks after the date of your surgery or on the date instructed by your surgeon outlined in the "After Visit Summary". ° ° °RANGE OF MOTION AND STRENGTHENING EXERCISES  °Rehabilitation of the knee is important following a knee injury or an operation. After just a few days of immobilization, the muscles of the thigh which control the knee become weakened and shrink (atrophy). Knee exercises are designed to build up the tone and strength of the thigh muscles and to improve knee motion. Often times heat used for twenty to thirty minutes before working out will loosen   up your tissues and help with improving the range of motion but do not use heat for the first two weeks following surgery. These exercises can be done on a training (exercise) mat, on the floor, on a table or on a bed. Use what ever works the best and is most comfortable for you Knee exercises include:  °Leg Lifts - While your knee is still immobilized in a splint or cast, you can do straight leg raises. Lift the leg to 60 degrees, hold for 3 sec, and slowly lower the leg. Repeat 10-20 times 2-3 times daily. Perform this exercise against resistance later as your knee gets better.  °Quad and Hamstring Sets - Tighten up the muscle on the front of the thigh (Quad) and hold for 5-10 sec. Repeat this 10-20 times hourly. Hamstring sets are done by pushing the foot backward against an object and holding for 5-10 sec. Repeat as with quad sets.  °· Leg Slides: Lying on your back, slowly slide your foot toward your buttocks, bending your knee up off the floor (only go as far as is comfortable). Then slowly slide your foot back down until your leg is flat on the floor again. °· Angel Wings:  Lying on your back spread your legs to the side as far apart as you can without causing discomfort.  °A rehabilitation program following serious knee injuries can speed recovery and prevent re-injury in the future due to weakened muscles. Contact your doctor or a physical therapist for more information on knee rehabilitation.  ° °IF YOU ARE TRANSFERRED TO A SKILLED REHAB FACILITY °If the patient is transferred to a skilled rehab facility following release from the hospital, a list of the current medications will be sent to the facility for the patient to continue.  When discharged from the skilled rehab facility, please have the facility set up the patient's Home Health Physical Therapy prior to being released. Also, the skilled facility will be responsible for providing the patient with their medications at time of release from the facility to include their pain medication, the muscle relaxants, and their blood thinner medication. If the patient is still at the rehab facility at time of the two week follow up appointment, the skilled rehab facility will also need to assist the patient in arranging follow up appointment in our office and any transportation needs. ° °MAKE SURE YOU:  °Understand these instructions.  °Get help right away if you are not doing well or get worse.  ° ° °Pick up stool softner and laxative for home use following surgery while on pain medications. °Do not submerge incision under water. °Please use good hand washing techniques while changing dressing each day. °May shower starting three days after surgery. °Please use a clean towel to pat the incision dry following showers. °Continue to use ice for pain and swelling after surgery. °Do not use any lotions or creams on the incision until instructed by your surgeon. ° ° °Information on my medicine - XARELTO® (Rivaroxaban) ° °Why was Xarelto® prescribed for you? °Xarelto® was prescribed for you to reduce the risk of blood clots forming after  orthopedic surgery. The medical term for these abnormal blood clots is venous thromboembolism (VTE). ° °What do you need to know about xarelto® ? °Take your Xarelto® ONCE DAILY at the same time every day. °You may take it either with or without food. ° °If you have difficulty swallowing the tablet whole, you may crush it and mix in applesauce just prior to   taking your dose. ° °Take Xarelto® exactly as prescribed by your doctor and DO NOT stop taking Xarelto® without talking to the doctor who prescribed the medication.  Stopping without other VTE prevention medication to take the place of Xarelto® may increase your risk of developing a clot. ° °After discharge, you should have regular check-up appointments with your healthcare provider that is prescribing your Xarelto®.   ° °What do you do if you miss a dose? °If you miss a dose, take it as soon as you remember on the same day then continue your regularly scheduled once daily regimen the next day. Do not take two doses of Xarelto® on the same day.  ° °Important Safety Information °A possible side effect of Xarelto® is bleeding. You should call your healthcare provider right away if you experience any of the following: °? Bleeding from an injury or your nose that does not stop. °? Unusual colored urine (red or dark brown) or unusual colored stools (red or black). °? Unusual bruising for unknown reasons. °? A serious fall or if you hit your head (even if there is no bleeding). ° °Some medicines may interact with Xarelto® and might increase your risk of bleeding while on Xarelto®. To help avoid this, consult your healthcare provider or pharmacist prior to using any new prescription or non-prescription medications, including herbals, vitamins, non-steroidal anti-inflammatory drugs (NSAIDs) and supplements. ° °This website has more information on Xarelto®: www.xarelto.com. °

## 2017-02-21 NOTE — H&P (Signed)
TOTAL KNEE ADMISSION H&P  Patient is being admitted for right total knee arthroplasty.  Subjective:  Chief Complaint:right knee pain.  HPI: Veronica Ho, 66 y.o. female, has a history of pain and functional disability in the right knee due to arthritis and has failed non-surgical conservative treatments for greater than 12 weeks to includeNSAID's and/or analgesics, corticosteriod injections, flexibility and strengthening excercises and activity modification.  Onset of symptoms was gradual, starting >10 years ago with gradually worsening course since that time. The patient noted no past surgery on the right knee(s).  Patient currently rates pain in the right knee(s) at 8 out of 10 with activity. Patient has night pain, worsening of pain with activity and weight bearing, pain that interferes with activities of daily living, pain with passive range of motion, crepitus and joint swelling.  Patient has evidence of periarticular osteophytes and joint space narrowing by imaging studies. There is no active infection.  Patient Active Problem List   Diagnosis Date Noted  . OA (osteoarthritis) of knee 02/21/2017   Past Medical History:  Diagnosis Date  . Arthritis   . Cancer ([place])    basal cell skin ca    Past Surgical History:  Procedure Laterality Date  . JOINT REPLACEMENT     Left knee 2015 in [place] [facility]     Prescriptions Prior to Admission  Medication Sig Dispense Refill Last Dose  . acetaminophen (TYLENOL) 325 MG tablet Take 325 mg by mouth daily.     Marland Kitchen acetaminophen (TYLENOL) 500 MG tablet Take 500-1,000 mg by mouth every 6 (six) hours as needed (for pain.).     Marland Kitchen ALPRAZolam (XANAX) 0.5 MG tablet Take 0.25-0.5 mg by mouth at bedtime.     . Biotin 2500 MCG CAPS Take 2,500 mcg by mouth daily.     . ferrous sulfate 325 (65 FE) MG tablet Take 325 mg by mouth once a week.     . Multiple Vitamin (MULTIVITAMIN WITH MINERALS) TABS tablet Take 1 tablet by mouth daily. RAINBOW  LIGHT MULTIVITAMIN     . Riboflavin (VITAMIN B-2 PO) Take 1 tablet by mouth daily.      No Known Allergies  Social History  Substance Use Topics  . Smoking status: Never Smoker  . Smokeless tobacco: Never Used  . Alcohol use No      Review of Systems  Constitutional: Negative.   HENT: Negative.   Eyes: Negative.   Respiratory: Negative.   Cardiovascular: Negative.   Gastrointestinal: Negative.   Genitourinary: Negative.   Musculoskeletal: Positive for joint pain and myalgias. Negative for back pain, falls and neck pain.  Skin: Negative.   Neurological: Negative.   Endo/Heme/Allergies: Negative.   Psychiatric/Behavioral: Negative for depression, hallucinations, memory loss, substance abuse and suicidal ideas. The patient is nervous/anxious. The patient does not have insomnia.     Objective:  Physical Exam  Constitutional: She is oriented to person, place, and time. She appears well-developed and well-nourished. No distress.  HENT:  Head: Normocephalic and atraumatic.  Right Ear: External ear normal.  Left Ear: External ear normal.  Nose: Nose normal.  Mouth/Throat: Oropharynx is clear and moist.  Eyes: Conjunctivae and EOM are normal.  Neck: Normal range of motion. Neck supple.  Cardiovascular: Normal rate, regular rhythm, normal heart sounds and intact distal pulses.   No murmur heard. Respiratory: Effort normal and breath sounds normal. No respiratory distress. She has no wheezes.  GI: Soft. Bowel sounds are normal. She exhibits no distension. There is no  tenderness.  Musculoskeletal:  Her hips show normal range of motion with no discomfort. Her right knee shows no effusion. There is a varus deformity present. Range of motion is 5 to 125. There is marked crepitus on range of motion with tenderness medial greater than lateral, and no instability noted. Left knee, no swelling, range 0 to 125 with no tenderness or instability.   Neurological: She is alert and oriented to  person, place, and time. She has normal strength and normal reflexes. No sensory deficit.  Skin: No rash noted. She is not diaphoretic. No erythema.  Psychiatric: She has a normal mood and affect. Her behavior is normal.    Vital signs in last 24 hours: Temp:  [98.4 F (36.9 C)] 98.4 F (36.9 C) (07/16 0831) Pulse Rate:  [52] 52 (07/16 0831) Resp:  [18] 18 (07/16 0831) BP: (139)/(84) 139/84 (07/16 0831) SpO2:  [98 %] 98 % (07/16 0831)  Labs:   Estimated body mass index is 19.39 kg/m as calculated from the following:   Height as of 02/16/17: 5\' 2"  (1.575 m).   Weight as of 02/16/17: 48.1 kg (106 lb).   Imaging Review Plain radiographs demonstrate severe degenerative joint disease of the right knee(s). The overall alignment ismild varus. The bone quality appears to be good for age and reported activity level.  Assessment/Plan:  End stage primary osteoarthritis, right knee   The patient history, physical examination, clinical judgment of the provider and imaging studies are consistent with end stage degenerative joint disease of the right knee(s) and total knee arthroplasty is deemed medically necessary. The treatment options including medical management, injection therapy arthroscopy and arthroplasty were discussed at length. The risks and benefits of total knee arthroplasty were presented and reviewed. The risks due to aseptic loosening, infection, stiffness, patella tracking problems, thromboembolic complications and other imponderables were discussed. The patient acknowledged the explanation, agreed to proceed with the plan and consent was signed. Patient is being admitted for inpatient treatment for surgery, pain control, PT, OT, prophylactic antibiotics, VTE prophylaxis, progressive ambulation and ADL's and discharge planning. The patient is planning to be discharged with direct outpatient therapy   PCP: Dr. Lindaann Pascal Therapy Plans: outpatient PT DME: given Rx for walker and  3-n-1 Home alone Other concerns: Reports that she needs "low/no sodium IV"; high anxiety TXA IV    Ardeen Jourdain, PA-C

## 2017-02-21 NOTE — Progress Notes (Signed)
AssistedDr. Ossey with right, ultrasound guided, adductor canal block. Side rails up, monitors on throughout procedure. See vital signs in flow sheet. Tolerated Procedure well.  

## 2017-02-21 NOTE — Progress Notes (Signed)
PT Cancellation Note  Patient Details Name: Veronica Ho MRN: [id number] DOB: 09-Mar-1951   Cancelled Treatment:    Reason Eval/Treat Not Completed: Medical issues which prohibited therapy RN reports pt with nausea and vertigo, blurred vision currently.     [name],[name] 02/21/2017, 4:24 PM [name], PT, DPT 02/21/2017 Pager: [phone]

## 2017-02-21 NOTE — Anesthesia Procedure Notes (Signed)
Spinal  Patient location during procedure: OR Start time: 02/21/2017 10:17 AM End time: 02/21/2017 10:20 AM Staffing Anesthesiologist: Lillia Abed Performed: anesthesiologist  Preanesthetic Checklist Completed: patient identified, surgical consent, pre-op evaluation, timeout performed, IV checked, risks and benefits discussed and monitors and equipment checked Spinal Block Patient position: sitting Prep: DuraPrep Patient monitoring: heart rate, cardiac monitor, continuous pulse ox and blood pressure Approach: right paramedian Location: L3-4 Injection technique: single-shot Needle Needle type: Pencan  Needle gauge: 24 G Needle length: 9 cm Needle insertion depth: 5 cm

## 2017-02-21 NOTE — Anesthesia Postprocedure Evaluation (Signed)
Anesthesia Post Note  Patient: Veronica Ho  Procedure(s) Performed: Procedure(s) (LRB): RIGHT TOTAL KNEE ARTHROPLASTY (Right)     Patient location during evaluation: PACU Anesthesia Type: Spinal Level of consciousness: oriented and awake and alert Pain management: pain level controlled Vital Signs Assessment: post-procedure vital signs reviewed and stable Respiratory status: spontaneous breathing, respiratory function stable and patient connected to nasal cannula oxygen Cardiovascular status: blood pressure returned to baseline and stable Postop Assessment: no headache and no backache Anesthetic complications: no    Last Vitals:  Vitals:   02/21/17 1245 02/21/17 1301  BP:  132/67  Pulse: (!) 55   Resp: 13 14  Temp: (!) 36.3 C (!) 36.3 C    Last Pain:  Vitals:   02/21/17 1230  TempSrc:   PainSc: 3                  Ferrah Panagopoulos DAVID

## 2017-02-22 LAB — BASIC METABOLIC PANEL
ANION GAP: 8 (ref 5–15)
BUN: 13 mg/dL (ref 6–20)
CO2: 25 mmol/L (ref 22–32)
CREATININE: 0.69 mg/dL (ref 0.44–1.00)
Calcium: 8.9 mg/dL (ref 8.9–10.3)
Chloride: 109 mmol/L (ref 101–111)
GFR calc Af Amer: >60 mL/min (ref >60)
GLUCOSE: 120 mg/dL — AB (ref 65–99)
Potassium: 4.6 mmol/L (ref 3.5–5.1)
Sodium: 142 mmol/L (ref 135–145)

## 2017-02-22 LAB — CBC
HEMATOCRIT: 35.1 % — AB (ref 36.0–46.0)
Hemoglobin: 11.4 g/dL — ABNORMAL LOW (ref 12.0–15.0)
MCH: 28.7 pg (ref 26.0–34.0)
MCHC: 32.5 g/dL (ref 30.0–36.0)
MCV: 88.4 fL (ref 78.0–100.0)
PLATELETS: 255 10*3/uL (ref 150–400)
RBC: 3.97 MIL/uL (ref 3.87–5.11)
RDW: 14 % (ref 11.5–15.5)
WBC: 13.4 10*3/uL — AB (ref 4.0–10.5)

## 2017-02-22 MED ORDER — TRAMADOL HCL 50 MG PO TABS: 50.0000 mg | ORAL_TABLET | Freq: Four times a day (QID) | ORAL | 0 refills | Status: DC | PRN

## 2017-02-22 MED ORDER — OXYCODONE HCL 5 MG PO TABS: 5.0000 mg | ORAL_TABLET | ORAL | 0 refills | Status: DC | PRN

## 2017-02-22 MED ORDER — TIZANIDINE HCL 4 MG PO TABS: 4.0000 mg | ORAL_TABLET | Freq: Three times a day (TID) | ORAL | 0 refills | Status: DC | PRN

## 2017-02-22 MED ORDER — SODIUM CHLORIDE 0.9 % IV BOLUS (SEPSIS)
250.0000 mL | Freq: Once | INTRAVENOUS | Status: AC
  Administered 2017-02-22: 12:00:00 250 mL via INTRAVENOUS

## 2017-02-22 MED ORDER — RIVAROXABAN 10 MG PO TABS: 10.0000 mg | ORAL_TABLET | Freq: Every day | ORAL | 0 refills | Status: DC

## 2017-02-22 NOTE — Progress Notes (Signed)
   Subjective: 1 Day Post-Op Procedure(s) (LRB): RIGHT TOTAL KNEE ARTHROPLASTY (Right) Patient reports pain as mild.   We will start therapy today.  Plan is to go Home after hospital stay.  Objective: Vital signs in last 24 hours: Temp:  [97.4 F (36.3 C)-98 F (36.7 C)] 97.9 F (36.6 C) (07/17 1005) Pulse Rate:  [46-76] 48 (07/17 1005) Resp:  [12-18] 15 (07/17 1005) BP: (95-135)/(44-83) 126/61 (07/17 1005) SpO2:  [95 %-100 %] 95 % (07/17 1005)  Intake/Output from previous day:  Intake/Output Summary (Last 24 hours) at 02/22/17 1015 Last data filed at 02/22/17 1000  Gross per 24 hour  Intake             3250 ml  Output             3225 ml  Net               25 ml    Intake/Output this shift: Total I/O In: 420 [P.O.:120; I.V.:300] Out: 575 [Urine:575]  Labs:  Recent Labs  02/22/17 0551  HGB 11.4*    Recent Labs  02/22/17 0551  WBC 13.4*  RBC 3.97  HCT 35.1*  PLT 255    Recent Labs  02/22/17 0551  NA 142  K 4.6  CL 109  CO2 25  BUN 13  CREATININE 0.69  GLUCOSE 120*  CALCIUM 8.9   No results for input(s): LABPT, INR in the last 72 hours.  EXAM General - Patient is Alert, Appropriate and Oriented Extremity - Neurologically intact Neurovascular intact No cellulitis present Compartment soft Dressing - dressing C/D/I Motor Function - intact, moving foot and toes well on exam.  Hemovac pulled without difficulty. BP low this AM but asymptomatic  Past Medical History:  Diagnosis Date  . Arthritis   . Cancer ([place])    basal cell skin ca    Assessment/Plan: 1 Day Post-Op Procedure(s) (LRB): RIGHT TOTAL KNEE ARTHROPLASTY (Right) Principal Problem:   OA (osteoarthritis) of knee   Advance diet Up with therapy D/C IV fluids Plan for discharge tomorrow  DVT Prophylaxis - Xarelto Weight-Bearing as tolerated to right leg   Jarone Ostergaard V 02/22/2017, 10:15 AM

## 2017-02-22 NOTE — Evaluation (Signed)
Physical Therapy Evaluation Patient Details Name: Veronica Ho MRN: [id number] DOB: Dec 09, 1950 Today's Date: 02/22/2017   History of Present Illness  (P) s/p R TKA  Clinical Impression  Pt is s/p TKA resulting in the deficits listed below (see PT Problem List). Pt will benefit from skilled PT to increase their independence and safety with mobility to allow discharge to the venue listed below.  Pt willing to work with PT despite pain level 10/10;     Follow Up Recommendations DC plan and follow up therapy as arranged by surgeon (VERA)    Equipment Recommendations  None recommended by PT    Recommendations for Other Services       Precautions / Restrictions Precautions Precautions: (P) Fall Precaution Comments: (P) IND SLRs--KI not used Required Braces or Orthoses: (P) Knee Immobilizer - Right Knee Immobilizer - Right: (P) Discontinue once straight leg raise with < 10 degree lag Restrictions Weight Bearing Restrictions: (P) No RLE Weight Bearing: (P) Weight bearing as tolerated Other Position/Activity Restrictions: (P) WBAT      Mobility  Bed Mobility               General bed mobility comments: (P) in recliner  Transfers Overall transfer level: (P) Needs assistance Equipment used: (P) Rolling walker (2 wheeled) Transfers: (P) Sit to/from Stand Sit to Stand: (P) Min guard         General transfer comment: (P) cues for hand placement, pt self cues  Ambulation/Gait Ambulation/Gait assistance: Min assist;Min guard Ambulation Distance (Feet): 60 Feet Assistive device: Rolling walker (2 wheeled) Gait Pattern/deviations: Step-to pattern;Antalgic     General Gait Details: cues for sequence and RW distance from self  Stairs            Wheelchair Mobility    Modified Rankin (Stroke Patients Only)       Balance Overall balance assessment: (P) Needs assistance           Standing balance-Leahy Scale: (P) Fair                                Pertinent Vitals/Pain Pain Assessment: 0-10 Pain Score: 10-Worst pain ever Pain Location: (P) right knee Pain Intervention(s): Monitored during session;Patient requesting pain meds-RN notified    Home Living Family/patient expects to be discharged to:: (P) Private residence Living Arrangements: (P) Alone   Type of Home: (P) Apartment Home Access: (P) Elevator       Home Equipment: (P) Walker - 2 wheels      Prior Function Level of Independence: (P) Independent               Hand Dominance        Extremity/Trunk Assessment   Upper Extremity Assessment Upper Extremity Assessment: Defer to OT evaluation    Lower Extremity Assessment Lower Extremity Assessment: RLE deficits/detail RLE Deficits / Details: (P) ankle WFL, knee and hip grossly 2+/5; AAROM knee flexion grossly 5 to 60*       Communication   Communication: (P) No difficulties  Cognition Arousal/Alertness: (P) Awake/alert Behavior During Therapy: (P) WFL for tasks assessed/performed Overall Cognitive Status: (P) Within Functional Limits for tasks assessed                                        General Comments      Exercises Total Joint Exercises  Ankle Circles/Pumps: (P) AROM;5 reps;Both Quad Sets: (P) 5 reps;Both;AROM Straight Leg Raises: (P) AROM;Strengthening;Right;10 reps   Assessment/Plan    PT Assessment Patient needs continued PT services  PT Problem List Decreased strength;Decreased activity tolerance;Decreased mobility;Pain;Decreased knowledge of use of DME       PT Treatment Interventions DME instruction;Gait training;Functional mobility training;Therapeutic activities;Therapeutic exercise;Patient/family education    PT Goals (Current goals can be found in the Care Plan section)  Acute Rehab PT Goals Patient Stated Goal: (P) home and get better PT Goal Formulation: With patient Time For Goal Achievement: 02/25/17 Potential to Achieve Goals: Good     Frequency 7X/week   Barriers to discharge        Co-evaluation               AM-PAC PT "6 Clicks" Daily Activity  Outcome Measure Difficulty turning over in bed (including adjusting bedclothes, sheets and blankets)?: A Little Difficulty moving from lying on back to sitting on the side of the bed? : A Little Difficulty sitting down on and standing up from a chair with arms (e.g., wheelchair, bedside commode, etc,.)?: A Little Help needed moving to and from a bed to chair (including a wheelchair)?: A Little Help needed walking in hospital room?: A Little Help needed climbing 3-5 steps with a railing? : A Lot 6 Click Score: 17    End of Session Equipment Utilized During Treatment: Gait belt Activity Tolerance: Patient tolerated treatment well Patient left: in chair;with chair alarm set;with call bell/phone within reach   PT Visit Diagnosis: Difficulty in walking, not elsewhere classified (R26.2);Pain Pain - Right/Left: Right Pain - part of body: Knee    Time: 1120-1147 PT Time Calculation (min) (ACUTE ONLY): 27 min   Charges:   PT Evaluation $PT Eval Low Complexity: 1 Procedure PT Treatments $Gait Training: 8-22 mins   PT G CodesKenyon Ana, PT Pager: [phone] 02/22/2017   [facility] 02/22/2017, 11:58 AM

## 2017-02-22 NOTE — Progress Notes (Signed)
Occupational Therapy Evaluation Patient Details Name: Veronica Ho MRN: [id number] DOB: September 27, 1950 Today's Date: 02/22/2017    History of Present Illness s/p R TKA   Clinical Impression   PTA, pt lived alone and was independent with ADL and mobility. Works as an Futures trader. Began education regarding compensatory techniques for ADL and functional mobility. Will follow acutely to complete education to facilitate safe DC home with intermittent S by friends.     Follow Up Recommendations  No OT follow up;Supervision - Intermittent    Equipment Recommendations  3 in 1 bedside commode    Recommendations for Other Services       Precautions / Restrictions Precautions Precautions: Fall Precaution Comments: IND SLRs--KI not used Required Braces or Orthoses: Knee Immobilizer - Right Knee Immobilizer - Right: Discontinue once straight leg raise with < 10 degree lag Restrictions Weight Bearing Restrictions: No Other Position/Activity Restrictions: WBAT      Mobility Bed Mobility Overal bed mobility: Modified Independent             General bed mobility comments: in recliner  Transfers Overall transfer level: Needs assistance Equipment used: Rolling walker (2 wheeled) Transfers: Sit to/from Stand Sit to Stand: Min guard         General transfer comment: cues for hand placement    Balance Overall balance assessment: Needs assistance           Standing balance-Leahy Scale: Fair                             ADL either performed or assessed with clinical judgement   ADL Overall ADL's : Needs assistance/impaired     Grooming: Set up;Standing   Upper Body Bathing: Set up;Sitting   Lower Body Bathing: Minimal assistance;Sit to/from stand   Upper Body Dressing : Set up;Sitting   Lower Body Dressing: Minimal assistance;Sit to/from stand Lower Body Dressing Details (indicate cue type and reason): difficulty reaching L foot. Demonstrated  useof reacherto assist wtih LB dressing. Educated on Dealer Transfer: Environmental consultant;Ambulation Toilet Transfer Details (indicate cue type and reason): repeated vc for correct hand placement Toileting- Clothing Manipulation and Hygiene: Supervision/safety;Sit to/from stand       Functional mobility during ADLs: Min guard;Rolling walker;Cueing for safety;Cueing for sequencing (repeated vc for hand placement) General ADL Comments: recommend 3 in 1 to use oas shower chair. Multiple conversations regarding using 3in1 as shower chair. Pt concerned if this will be paid for by Medicare. CM also explained that 3in1 willbe covered and can be used as shower chair.     Vision         Perception     Praxis      Pertinent Vitals/Pain Pain Assessment: 0-10 Pain Score: 4  Pain Location: right knee Pain Descriptors / Indicators: Aching;Discomfort Pain Intervention(s): Limited activity within patient's tolerance;Ice applied;Repositioned     Hand Dominance Right   Extremity/Trunk Assessment Upper Extremity Assessment Upper Extremity Assessment: Overall WFL for tasks assessed   Lower Extremity Assessment Lower Extremity Assessment: Defer to PT evaluation RLE Deficits / Details: ankle WFL, knee and hip grossly 2+/5; AAROM knee flexion grossly 5 to 60*   Cervical / Trunk Assessment Cervical / Trunk Assessment: Normal   Communication Communication Communication: No difficulties   Cognition Arousal/Alertness: Awake/alert Behavior During Therapy: WFL for tasks assessed/performed Overall Cognitive Status: Within Functional Limits for tasks assessed  General Comments: very talkative   General Comments       Exercises Total Joint Exercises Ankle Circles/Pumps: AROM;5 reps;Both Quad Sets: 5 reps;Both;AROM Straight Leg Raises: AROM;Strengthening;Right;10 reps   Shoulder Instructions      Home Living  Family/patient expects to be discharged to:: Private residence Living Arrangements: Alone Available Help at Discharge: Friend(s);Available PRN/intermittently Type of Home: Apartment Home Access: Elevator     Home Layout: One level     Bathroom Shower/Tub: Walk-in shower ("tiny lip")   Bathroom Toilet: Standard Bathroom Accessibility: Yes How Accessible: Accessible via walker Home Equipment: Walker - 2 wheels          Prior Functioning/Environment Level of Independence: Independent        Comments: works as Magazine features editor to [employer] Problem List: Decreased strength;Decreased range of motion;Impaired balance (sitting and/or standing);Decreased safety awareness;Decreased knowledge of use of DME or AE;Decreased knowledge of precautions;Pain      OT Treatment/Interventions: Self-care/ADL training;DME and/or AE instruction;Therapeutic activities;Patient/family education    OT Goals(Current goals can be found in the care plan section) Acute Rehab OT Goals Patient Stated Goal: home and get better OT Goal Formulation: With patient Time For Goal Achievement: 03/08/17 Potential to Achieve Goals: Good ADL Goals Pt Will Perform Lower Body Dressing: with modified independence;sit to/from stand;with adaptive equipment Pt Will Transfer to Toilet: with modified independence;bedside commode;ambulating Pt Will Perform Toileting - Clothing Manipulation and hygiene: with modified independence;sit to/from stand Pt Will Perform Tub/Shower Transfer: with modified independence;rolling walker;ambulating;3 in 1 Additional ADL Goal #1: Pt will independently verbalize 3 astrategies to reduce risk of falls at home  OT Frequency: Min 2X/week   Barriers to D/C:            Co-evaluation              AM-PAC PT "6 Clicks" Daily Activity     Outcome Measure Help from another person eating meals?: None Help from another person taking care of personal grooming?: None Help  from another person toileting, which includes using toliet, bedpan, or urinal?: A Little Help from another person bathing (including washing, rinsing, drying)?: A Little Help from another person to put on and taking off regular upper body clothing?: None Help from another person to put on and taking off regular lower body clothing?: A Little 6 Click Score: 21   End of Session Equipment Utilized During Treatment: Gait belt;Rolling walker Nurse Communication: Mobility status  Activity Tolerance: Patient tolerated treatment well Patient left: in chair;in CPM  OT Visit Diagnosis: Other abnormalities of gait and mobility (R26.89);Pain Pain - Right/Left: Right Pain - part of body: Knee                Time: 1030-1102 OT Time Calculation (min): 32 min Charges:  OT General Charges $OT Visit: 1 Procedure OT Evaluation $OT Eval Low Complexity: 1 Procedure OT Treatments $Self Care/Home Management : 8-22 mins G-Codes:     [facility], OT/L  [phone] 02/22/2017  Devontae Casasola,HILLARY 02/22/2017, 12:10 PM

## 2017-02-22 NOTE — Progress Notes (Signed)
Neighbor to assist at [place]. Lives in an apt with an elevator. Plan for Virtual PT, Rx for RW and picked up from [facility], requesting 3-in-1.  Contacted [facility] to deliver to room. [phone]

## 2017-02-22 NOTE — Progress Notes (Signed)
02/22/17 1200  PT Visit Information  Last PT Received On 02/22/17  Assistance Needed +1  History of Present Illness s/p R TKA  Subjective Data  Patient Stated Goal home and get better  Precautions  Precautions Fall  Precaution Comments IND SLRs--KI not used  Knee Immobilizer - Right Discontinue once straight leg raise with < 10 degree lag  Restrictions  Weight Bearing Restrictions No  Other Position/Activity Restrictions WBAT  Pain Assessment  Pain Assessment 0-10  Pain Score 8  Pain Location right knee  Pain Descriptors / Indicators Aching;Discomfort  Pain Intervention(s) Limited activity within patient's tolerance;Monitored during session;Repositioned;Patient requesting pain meds-RN notified;Ice applied  Cognition  Arousal/Alertness Awake/alert  Behavior During Therapy WFL for tasks assessed/performed  Overall Cognitive Status Within Functional Limits for tasks assessed  General Comments very talkative  Bed Mobility  Overal bed mobility Needs Assistance  Bed Mobility Sit to Supine  Sit to supine Min assist  General bed mobility comments light assist with LEs  Transfers  Overall transfer level Needs assistance  Equipment used Rolling walker (2 wheeled)  Transfers Sit to/from [employer] Transfers  Sit to Stand Min guard  Stand pivot transfers Min guard  General transfer comment cues for hand placement  Total Joint Exercises  Ankle Circles/Pumps AROM;Both;10 reps  Quad Sets AROM;Both;10 reps  Straight Leg Raises AROM;Strengthening;Right;10 reps  Short Arc Doretha Imus;Right;10 reps;Strengthening  Heel Slides AAROM;Right;10 reps  Hip ABduction/ADduction AROM;Strengthening;Right;10 reps  PT - End of Session  Equipment Utilized During Treatment Gait belt  Activity Tolerance Patient tolerated treatment well  Patient left in bed;with call bell/phone within reach;with bed alarm set  PT - Assessment/Plan  PT Plan Current plan remains appropriate  PT Visit Diagnosis  Difficulty in walking, not elsewhere classified (R26.2);Pain  Pain - Right/Left Right  Pain - part of body Knee  PT Frequency (ACUTE ONLY) 7X/week  Follow Up Recommendations DC plan and follow up therapy as arranged by surgeon  PT equipment None recommended by PT  AM-PAC PT "6 Clicks" Daily Activity Outcome Measure  Difficulty turning over in bed (including adjusting bedclothes, sheets and blankets)? 3  Difficulty moving from lying on back to sitting on the side of the bed?  3  Difficulty sitting down on and standing up from a chair with arms (e.g., wheelchair, bedside commode, etc,.)? 3  Help needed moving to and from a bed to chair (including a wheelchair)? 3  Help needed walking in hospital room? 3  Help needed climbing 3-5 steps with a railing?  2  6 Click Score 17  Mobility G Code  CK  PT Goal Progression  Progress towards PT goals Progressing toward goals  Acute Rehab PT Goals  PT Goal Formulation With patient  Time For Goal Achievement 02/25/17  Potential to Achieve Goals Good  PT Time Calculation  PT Start Time (ACUTE ONLY) 1220  PT Stop Time (ACUTE ONLY) 1252  PT Time Calculation (min) (ACUTE ONLY) 32 min  PT General Charges  $$ ACUTE PT VISIT 1 Procedure  PT Treatments  $Therapeutic Exercise 8-22 mins  $Therapeutic Activity 8-22 mins

## 2017-02-22 NOTE — Progress Notes (Signed)
Orthopedic Tech Progress Note Patient Details:  Veronica Ho 1951-05-14 [id number]  Patient ID: Veronica Ho, female   DOB: [birth year], 66 y.o.   MRN: [id number]   Veronica Ho 02/22/2017, 7:42 PMPatient CPM log, Pt in CPM from 1600 till 1830 for a total of two hours 30 minutes.

## 2017-02-23 LAB — BASIC METABOLIC PANEL
ANION GAP: 8 (ref 5–15)
BUN: 12 mg/dL (ref 6–20)
CALCIUM: 8.4 mg/dL — AB (ref 8.9–10.3)
CHLORIDE: 108 mmol/L (ref 101–111)
CO2: 26 mmol/L (ref 22–32)
Creatinine, Ser: 0.66 mg/dL (ref 0.44–1.00)
GFR calc non Af Amer: >60 mL/min (ref >60)
Glucose, Bld: 99 mg/dL (ref 65–99)
Potassium: 3.6 mmol/L (ref 3.5–5.1)
Sodium: 142 mmol/L (ref 135–145)

## 2017-02-23 LAB — CBC
HEMATOCRIT: 28.4 % — AB (ref 36.0–46.0)
HEMOGLOBIN: 9.5 g/dL — AB (ref 12.0–15.0)
MCH: 29.1 pg (ref 26.0–34.0)
MCHC: 33.5 g/dL (ref 30.0–36.0)
MCV: 87.1 fL (ref 78.0–100.0)
Platelets: 215 10*3/uL (ref 150–400)
RBC: 3.26 MIL/uL — ABNORMAL LOW (ref 3.87–5.11)
RDW: 14.5 % (ref 11.5–15.5)
WBC: 11.7 10*3/uL — AB (ref 4.0–10.5)

## 2017-02-23 MED ORDER — FERROUS SULFATE 325 (65 FE) MG PO TABS: 325.0000 mg | ORAL_TABLET | Freq: Every day | ORAL | 0 refills | Status: DC

## 2017-02-23 MED ORDER — ALPRAZOLAM 0.5 MG PO TABS: 0.2500 mg | ORAL_TABLET | Freq: Every day | ORAL | 0 refills | Status: AC

## 2017-02-23 MED ORDER — FERROUS SULFATE 325 (65 FE) MG PO TABS
325.0000 mg | ORAL_TABLET | Freq: Every day | ORAL | Status: DC
  Administered 2017-02-23: 325 mg via ORAL
  Filled 2017-02-23: qty 1

## 2017-02-23 NOTE — Discharge Summary (Signed)
Physician Discharge Summary   Patient ID: Veronica Ho MRN: [id number] DOB/AGE: January 28, 1951 66 y.o.  Admit date: 02/21/2017 Discharge date: 02/23/2017  Primary Diagnosis: Primary osteoarthritis right knee  Admission Diagnoses:  Past Medical History:  Diagnosis Date  . Arthritis   . Cancer ([place])    basal cell skin ca   Discharge Diagnoses:   Principal Problem:   OA (osteoarthritis) of knee  Estimated body mass index is 19.39 kg/m as calculated from the following:   Height as of this encounter: '5\' 2"'  (1.575 m).   Weight as of this encounter: 48.1 kg (106 lb).  Procedure:  Procedure(s) (LRB): RIGHT TOTAL KNEE ARTHROPLASTY (Right)   Consults: None  HPI: Veronica Ho, 66 y.o. female, has a history of pain and functional disability in the right knee due to arthritis and has failed non-surgical conservative treatments for greater than 12 weeks to includeNSAID's and/or analgesics, corticosteriod injections, flexibility and strengthening excercises and activity modification.  Onset of symptoms was gradual, starting >10 years ago with gradually worsening course since that time. The patient noted no past surgery on the right knee(s).  Patient currently rates pain in the right knee(s) at 8 out of 10 with activity. Patient has night pain, worsening of pain with activity and weight bearing, pain that interferes with activities of daily living, pain with passive range of motion, crepitus and joint swelling.  Patient has evidence of periarticular osteophytes and joint space narrowing by imaging studies. There is no active infection.  Laboratory Data: Admission on 02/21/2017, Discharged on 02/23/2017  Component Date Value Ref Range Status  . WBC 02/22/2017 13.4* 4.0 - 10.5 K/uL Final  . RBC 02/22/2017 3.97  3.87 - 5.11 MIL/uL Final  . Hemoglobin 02/22/2017 11.4* 12.0 - 15.0 g/dL Final  . HCT 02/22/2017 35.1* 36.0 - 46.0 % Final  . MCV 02/22/2017 88.4  78.0 - 100.0 fL Final  . MCH  02/22/2017 28.7  26.0 - 34.0 pg Final  . MCHC 02/22/2017 32.5  30.0 - 36.0 g/dL Final  . RDW 02/22/2017 14.0  11.5 - 15.5 % Final  . Platelets 02/22/2017 255  150 - 400 K/uL Final  . Sodium 02/22/2017 142  135 - 145 mmol/L Final  . Potassium 02/22/2017 4.6  3.5 - 5.1 mmol/L Final  . Chloride 02/22/2017 109  101 - 111 mmol/L Final  . CO2 02/22/2017 25  22 - 32 mmol/L Final  . Glucose, Bld 02/22/2017 120* 65 - 99 mg/dL Final  . BUN 02/22/2017 13  6 - 20 mg/dL Final  . Creatinine, Ser 02/22/2017 0.69  0.44 - 1.00 mg/dL Final  . Calcium 02/22/2017 8.9  8.9 - 10.3 mg/dL Final  . GFR calc non Af Amer 02/22/2017 >60  >60 mL/min Final  . GFR calc Af Amer 02/22/2017 >60  >60 mL/min Final   Comment: (NOTE) The eGFR has been calculated using the CKD EPI equation. This calculation has not been validated in all clinical situations. eGFR's persistently <60 mL/min signify possible Chronic Kidney Disease.   . Anion gap 02/22/2017 8  5 - 15 Final  . WBC 02/23/2017 11.7* 4.0 - 10.5 K/uL Final  . RBC 02/23/2017 3.26* 3.87 - 5.11 MIL/uL Final  . Hemoglobin 02/23/2017 9.5* 12.0 - 15.0 g/dL Final  . HCT 02/23/2017 28.4* 36.0 - 46.0 % Final  . MCV 02/23/2017 87.1  78.0 - 100.0 fL Final  . MCH 02/23/2017 29.1  26.0 - 34.0 pg Final  . MCHC 02/23/2017 33.5  30.0 - 36.0 g/dL Final  .  RDW 02/23/2017 14.5  11.5 - 15.5 % Final  . Platelets 02/23/2017 215  150 - 400 K/uL Final  . Sodium 02/23/2017 142  135 - 145 mmol/L Final  . Potassium 02/23/2017 3.6  3.5 - 5.1 mmol/L Final   DELTA CHECK NOTED  . Chloride 02/23/2017 108  101 - 111 mmol/L Final  . CO2 02/23/2017 26  22 - 32 mmol/L Final  . Glucose, Bld 02/23/2017 99  65 - 99 mg/dL Final  . BUN 02/23/2017 12  6 - 20 mg/dL Final  . Creatinine, Ser 02/23/2017 0.66  0.44 - 1.00 mg/dL Final  . Calcium 02/23/2017 8.4* 8.9 - 10.3 mg/dL Final  . GFR calc non Af Amer 02/23/2017 >60  >60 mL/min Final  . GFR calc Af Amer 02/23/2017 >60  >60 mL/min Final   Comment:  (NOTE) The eGFR has been calculated using the CKD EPI equation. This calculation has not been validated in all clinical situations. eGFR's persistently <60 mL/min signify possible Chronic Kidney Disease.   Georgiann Hahn gap 02/23/2017 8  5 - 15 Final  Hospital Outpatient Visit on 02/16/2017  Component Date Value Ref Range Status  . aPTT 02/16/2017 43* 24 - 36 seconds Final   Comment:        IF BASELINE aPTT IS ELEVATED, SUGGEST PATIENT RISK ASSESSMENT BE USED TO DETERMINE APPROPRIATE ANTICOAGULANT THERAPY.   . WBC 02/16/2017 7.3  4.0 - 10.5 K/uL Final  . RBC 02/16/2017 4.61  3.87 - 5.11 MIL/uL Final  . Hemoglobin 02/16/2017 13.4  12.0 - 15.0 g/dL Final  . HCT 02/16/2017 40.3  36.0 - 46.0 % Final  . MCV 02/16/2017 87.4  78.0 - 100.0 fL Final  . MCH 02/16/2017 29.1  26.0 - 34.0 pg Final  . MCHC 02/16/2017 33.3  30.0 - 36.0 g/dL Final  . RDW 02/16/2017 14.1  11.5 - 15.5 % Final  . Platelets 02/16/2017 277  150 - 400 K/uL Final  . Sodium 02/16/2017 143  135 - 145 mmol/L Final  . Potassium 02/16/2017 3.8  3.5 - 5.1 mmol/L Final  . Chloride 02/16/2017 107  101 - 111 mmol/L Final  . CO2 02/16/2017 29  22 - 32 mmol/L Final  . Glucose, Bld 02/16/2017 104* 65 - 99 mg/dL Final  . BUN 02/16/2017 11  6 - 20 mg/dL Final  . Creatinine, Ser 02/16/2017 0.67  0.44 - 1.00 mg/dL Final  . Calcium 02/16/2017 9.2  8.9 - 10.3 mg/dL Final  . Total Protein 02/16/2017 7.1  6.5 - 8.1 g/dL Final  . Albumin 02/16/2017 4.5  3.5 - 5.0 g/dL Final  . AST 02/16/2017 26  15 - 41 U/L Final  . ALT 02/16/2017 15  14 - 54 U/L Final  . Alkaline Phosphatase 02/16/2017 55  38 - 126 U/L Final  . Total Bilirubin 02/16/2017 0.5  0.3 - 1.2 mg/dL Final  . GFR calc non Af Amer 02/16/2017 >60  >60 mL/min Final  . GFR calc Af Amer 02/16/2017 >60  >60 mL/min Final   Comment: (NOTE) The eGFR has been calculated using the CKD EPI equation. This calculation has not been validated in all clinical situations. eGFR's persistently <60  mL/min signify possible Chronic Kidney Disease.   . Anion gap 02/16/2017 7  5 - 15 Final  . Prothrombin Time 02/16/2017 13.7  11.4 - 15.2 seconds Final  . INR 02/16/2017 1.04   Final  . ABO/RH(D) 02/16/2017 AB POS   Final  . Antibody Screen 02/16/2017 NEG  Final  . Sample Expiration 02/16/2017 02/24/2017   Final  . Extend sample reason 02/16/2017 NO TRANSFUSIONS OR PREGNANCY IN THE PAST 3 MONTHS   Final  . MRSA, PCR 02/16/2017 NEGATIVE  NEGATIVE Final  . Staphylococcus aureus 02/16/2017 POSITIVE* NEGATIVE Final   Comment:        The Xpert SA Assay (FDA approved for NASAL specimens in patients over 62 years of age), is one component of a comprehensive surveillance program.  Test performance has been validated by [facility] for patients greater than or equal to 41 year old. It is not intended to diagnose infection nor to guide or monitor treatment.   . ABO/RH(D) 02/16/2017 AB POS   Final     Hospital Course: Elisha Mcgruder is a 66 y.o. who was admitted to [facility]. They were brought to the operating room on 02/21/2017 and underwent Procedure(s): RIGHT TOTAL KNEE ARTHROPLASTY.  Patient tolerated the procedure well and was later transferred to the recovery room and then to the orthopaedic floor for postoperative care.  They were given PO and IV analgesics for pain control following their surgery.  They were given 24 hours of postoperative antibiotics of  Anti-infectives    Start     Dose/Rate Route Frequency Ordered Stop   02/21/17 1630  ceFAZolin (ANCEF) IVPB 1 g/50 mL premix     1 g 100 mL/hr over 30 Minutes Intravenous Every 6 hours 02/21/17 1312 02/21/17 2249   02/21/17 0813  ceFAZolin (ANCEF) 2-4 GM/100ML-% IVPB    Comments:  Harvell, Gwendolyn  : cabinet override      02/21/17 0813 02/21/17 1020   02/21/17 0806  ceFAZolin (ANCEF) IVPB 2g/100 mL premix     2 g 200 mL/hr over 30 Minutes Intravenous On call to O.R. 02/21/17 9735 02/21/17 1020     and started on  DVT prophylaxis in the form of Xarelto.   PT and OT were ordered for total joint protocol.  Discharge planning consulted to help with postop disposition and equipment needs.  Patient had a fair night on the evening of surgery.  They started to get up OOB with therapy on day one. Hemovac drain was pulled without difficulty.  She had some difficulty sleeping which did improve night two. Continued to work with therapy into day two.  Dressing was changed on day two and the incision was clean and dry. The patient had progressed with therapy and meeting their goals.  Incision was healing well.  Patient was seen in rounds and was ready to go home.   Diet: Cardiac diet Activity:WBAT Follow-up:in 2 weeks Disposition - Home Discharged Condition: stable   Discharge Instructions    Call MD / Call 911    Complete by:  As directed    If you experience chest pain or shortness of breath, CALL 911 and be transported to the hospital emergency room.  If you develope a fever above 101 F, pus (white drainage) or increased drainage or redness at the wound, or calf pain, call your surgeon's office.   Constipation Prevention    Complete by:  As directed    Drink plenty of fluids.  Prune juice may be helpful.  You may use a stool softener, such as Colace (over the counter) 100 mg twice a day.  Use MiraLax (over the counter) for constipation as needed.   Diet - low sodium heart healthy    Complete by:  As directed    Discharge instructions    Complete by:  As directed    Dr. Gaynelle Arabian Total Joint Specialist [facility] [address], [place], [place] [address] [phone]  TOTAL KNEE REPLACEMENT POSTOPERATIVE DIRECTIONS  Knee Rehabilitation, Guidelines Following Surgery  Results after knee surgery are often greatly improved when you follow the exercise, range of motion and muscle strengthening exercises prescribed by your doctor. Safety measures are also important to protect the knee  from further injury. Any time any of these exercises cause you to have increased pain or swelling in your knee joint, decrease the amount until you are comfortable again and slowly increase them. If you have problems or questions, call your caregiver or physical therapist for advice.   HOME CARE INSTRUCTIONS  Remove items at home which could result in a fall. This includes throw rugs or furniture in walking pathways.  ICE to the affected knee every three hours for 30 minutes at a time and then as needed for pain and swelling.  Continue to use ice on the knee for pain and swelling from surgery. You may notice swelling that will progress down to the foot and ankle.  This is normal after surgery.  Elevate the leg when you are not up walking on it.   Continue to use the breathing machine which will help keep your temperature down.  It is common for your temperature to cycle up and down following surgery, especially at night when you are not up moving around and exerting yourself.  The breathing machine keeps your lungs expanded and your temperature down. Do not place pillow under knee, focus on keeping the knee straight while resting  DIET You may resume your previous home diet once your are discharged from the hospital.  DRESSING / WOUND CARE / SHOWERING You may start showering once you are discharged home but do not submerge the incision under water. Just pat the incision dry and apply a dry gauze dressing on daily. Change the surgical dressing daily and reapply a dry dressing each time.  ACTIVITY Walk with your walker as instructed. Use walker as long as suggested by your caregivers. Avoid periods of inactivity such as sitting longer than an hour when not asleep. This helps prevent blood clots.  You may resume a sexual relationship in one month or when given the OK by your doctor.  You may return to work once you are cleared by your doctor.  Do not drive a car for 6 weeks or until released by you  surgeon.  Do not drive while taking narcotics.  WEIGHT BEARING Weight bearing as tolerated with assist device (walker, cane, etc) as directed, use it as long as suggested by your surgeon or therapist, typically at least 4-6 weeks.  POSTOPERATIVE CONSTIPATION PROTOCOL Constipation - defined medically as fewer than three stools per week and severe constipation as less than one stool per week.  One of the most common issues patients have following surgery is constipation.  Even if you have a regular bowel pattern at home, your normal regimen is likely to be disrupted due to multiple reasons following surgery.  Combination of anesthesia, postoperative narcotics, change in appetite and fluid intake all can affect your bowels.  In order to avoid complications following surgery, here are some recommendations in order to help you during your recovery period.  Colace (docusate) - Pick up an over-the-counter form of Colace or another stool softener and take twice a day as long as you are requiring postoperative pain medications.  Take with a full glass  of water daily.  If you experience loose stools or diarrhea, hold the colace until you stool forms back up.  If your symptoms do not get better within 1 week or if they get worse, check with your doctor.  Dulcolax (bisacodyl) - Pick up over-the-counter and take as directed by the product packaging as needed to assist with the movement of your bowels.  Take with a full glass of water.  Use this product as needed if not relieved by Colace only.   MiraLax (polyethylene glycol) - Pick up over-the-counter to have on hand.  MiraLax is a solution that will increase the amount of water in your bowels to assist with bowel movements.  Take as directed and can mix with a glass of water, juice, soda, coffee, or tea.  Take if you go more than two days without a movement. Do not use MiraLax more than once per day. Call your doctor if you are still constipated or irregular  after using this medication for 7 days in a row.  If you continue to have problems with postoperative constipation, please contact the office for further assistance and recommendations.  If you experience "the worst abdominal pain ever" or develop nausea or vomiting, please contact the office immediatly for further recommendations for treatment.  ITCHING  If you experience itching with your medications, try taking only a single pain pill, or even half a pain pill at a time.  You can also use Benadryl over the counter for itching or also to help with sleep.   TED HOSE STOCKINGS Wear the elastic stockings on both legs for three weeks following surgery during the day but you may remove then at night for sleeping.  MEDICATIONS See your medication summary on the "After Visit Summary" that the nursing staff will review with you prior to discharge.  You may have some home medications which will be placed on hold until you complete the course of blood thinner medication.  It is important for you to complete the blood thinner medication as prescribed by your surgeon.  Continue your approved medications as instructed at time of discharge.  PRECAUTIONS If you experience chest pain or shortness of breath - call 911 immediately for transfer to the hospital emergency department.  If you develop a fever greater that 101 F, purulent drainage from wound, increased redness or drainage from wound, foul odor from the wound/dressing, or calf pain - CONTACT YOUR SURGEON.                                                   FOLLOW-UP APPOINTMENTS Make sure you keep all of your appointments after your operation with your surgeon and caregivers. You should call the office at the above phone number and make an appointment for approximately two weeks after the date of your surgery or on the date instructed by your surgeon outlined in the "After Visit Summary".   RANGE OF MOTION AND STRENGTHENING EXERCISES  Rehabilitation of  the knee is important following a knee injury or an operation. After just a few days of immobilization, the muscles of the thigh which control the knee become weakened and shrink (atrophy). Knee exercises are designed to build up the tone and strength of the thigh muscles and to improve knee motion. Often times heat used for twenty to thirty minutes before working out  will loosen up your tissues and help with improving the range of motion but do not use heat for the first two weeks following surgery. These exercises can be done on a training (exercise) mat, on the floor, on a table or on a bed. Use what ever works the best and is most comfortable for you Knee exercises include:  Leg Lifts - While your knee is still immobilized in a splint or cast, you can do straight leg raises. Lift the leg to 60 degrees, hold for 3 sec, and slowly lower the leg. Repeat 10-20 times 2-3 times daily. Perform this exercise against resistance later as your knee gets better.  Quad and Hamstring Sets - Tighten up the muscle on the front of the thigh (Quad) and hold for 5-10 sec. Repeat this 10-20 times hourly. Hamstring sets are done by pushing the foot backward against an object and holding for 5-10 sec. Repeat as with quad sets.  Leg Slides: Lying on your back, slowly slide your foot toward your buttocks, bending your knee up off the floor (only go as far as is comfortable). Then slowly slide your foot back down until your leg is flat on the floor again. Angel Wings: Lying on your back spread your legs to the side as far apart as you can without causing discomfort.  A rehabilitation program following serious knee injuries can speed recovery and prevent re-injury in the future due to weakened muscles. Contact your doctor or a physical therapist for more information on knee rehabilitation.   IF YOU ARE TRANSFERRED TO A SKILLED REHAB FACILITY If the patient is transferred to a skilled rehab facility following release from the  hospital, a list of the current medications will be sent to the facility for the patient to continue.  When discharged from the skilled rehab facility, please have the facility set up the patient's [place] prior to being released. Also, the skilled facility will be responsible for providing the patient with their medications at time of release from the facility to include their pain medication, the muscle relaxants, and their blood thinner medication. If the patient is still at the rehab facility at time of the two week follow up appointment, the skilled rehab facility will also need to assist the patient in arranging follow up appointment in our office and any transportation needs.  MAKE SURE YOU:  Understand these instructions.  Get help right away if you are not doing well or get worse.    Pick up stool softner and laxative for home use following surgery while on pain medications. Do not submerge incision under water. Please use good hand washing techniques while changing dressing each day. May shower starting three days after surgery. Please use a clean towel to pat the incision dry following showers. Continue to use ice for pain and swelling after surgery. Do not use any lotions or creams on the incision until instructed by your surgeon.   Information on my medicine - XARELTO (Rivaroxaban)  Why was Xarelto prescribed for you? Xarelto was prescribed for you to reduce the risk of blood clots forming after orthopedic surgery. The medical term for these abnormal blood clots is venous thromboembolism (VTE).  What do you need to know about xarelto ? Take your Xarelto ONCE DAILY at the same time every day. You may take it either with or without food.  If you have difficulty swallowing the tablet whole, you may crush it and mix in applesauce just prior to taking  your dose.  Take Xarelto exactly as prescribed by your doctor and DO NOT stop taking Xarelto without  talking to the doctor who prescribed the medication.  Stopping without other VTE prevention medication to take the place of Xarelto may increase your risk of developing a clot.  After discharge, you should have regular check-up appointments with your healthcare provider that is prescribing your Xarelto.    What do you do if you miss a dose? If you miss a dose, take it as soon as you remember on the same day then continue your regularly scheduled once daily regimen the next day. Do not take two doses of Xarelto on the same day.   Important Safety Information A possible side effect of Xarelto is bleeding. You should call your healthcare provider right away if you experience any of the following: Bleeding from an injury or your nose that does not stop. Unusual colored urine (red or dark brown) or unusual colored stools (red or black). Unusual bruising for unknown reasons. A serious fall or if you hit your head (even if there is no bleeding).  Some medicines may interact with Xarelto and might increase your risk of bleeding while on Xarelto. To help avoid this, consult your healthcare provider or pharmacist prior to using any new prescription or non-prescription medications, including herbals, vitamins, non-steroidal anti-inflammatory drugs (NSAIDs) and supplements.  This website has more information on Xarelto: [email or web address]   Increase activity slowly as tolerated    Complete by:  As directed      Allergies as of 02/23/2017   No Known Allergies     Medication List    STOP taking these medications   Biotin 2500 MCG Caps   multivitamin with minerals Tabs tablet   VITAMIN B-2 PO     TAKE these medications   acetaminophen 500 MG tablet Commonly known as:  TYLENOL Take 500-1,000 mg by mouth every 6 (six) hours as needed (for pain.). What changed:  Another medication with the same name was removed. Continue taking this medication, and follow the directions you see here.     ALPRAZolam 0.5 MG tablet Commonly known as:  XANAX Take 0.5-1 tablets (0.25-0.5 mg total) by mouth at bedtime.   ferrous sulfate 325 (65 FE) MG tablet Take 1 tablet (325 mg total) by mouth daily with breakfast. What changed:  when to take this   oxyCODONE 5 MG immediate release tablet Commonly known as:  Oxy IR/ROXICODONE Take 1-2 tablets (5-10 mg total) by mouth every 3 (three) hours as needed for breakthrough pain.   rivaroxaban 10 MG Tabs tablet Commonly known as:  XARELTO Take 1 tablet (10 mg total) by mouth daily with breakfast.   tiZANidine 4 MG tablet Commonly known as:  ZANAFLEX Take 1 tablet (4 mg total) by mouth every 8 (eight) hours as needed for muscle spasms.   traMADol 50 MG tablet Commonly known as:  ULTRAM Take 1-2 tablets (50-100 mg total) by mouth every 6 (six) hours as needed for moderate pain.            Durable Medical Equipment        Start     Ordered   02/22/17 1108  For home use only DME 3 n 1  Once     02/22/17 1108     Follow-up Information    Aluisio, Pilar Plate, MD. Schedule an appointment as soon as possible for a visit on 03/08/2017.   Specialty:  Orthopedic Surgery Why:  Call [phone] tomorrow to  make the appointment Contact information: [address] [place] [address] [phone]           Signed: Ardeen Jourdain, PA-C Orthopaedic Surgery 02/23/2017, 1:10 PM

## 2017-02-23 NOTE — Progress Notes (Signed)
   Subjective: 2 Days Post-Op Procedure(s) (LRB): RIGHT TOTAL KNEE ARTHROPLASTY (Right) Patient reports pain as mild.   Patient seen in rounds for Dr. Wynelle Link. Patient is well, and has had no acute complaints or problems. She reports that she was able to get some rest last night after taking the Xanax. Voiding well. Positive flatus. No SOB or chest pain.    Objective: Vital signs in last 24 hours: Temp:  [97.5 F (36.4 C)-98.4 F (36.9 C)] 98.2 F (36.8 C) (07/18 0600) Pulse Rate:  [58-61] 58 (07/18 0600) Resp:  [16-18] 18 (07/18 0600) BP: (108-126)/(60-67) 108/60 (07/18 0600) SpO2:  [96 %-99 %] 96 % (07/18 0600)  Intake/Output from previous day:  Intake/Output Summary (Last 24 hours) at 02/23/17 1110 Last data filed at 02/23/17 0940  Gross per 24 hour  Intake             1100 ml  Output             2025 ml  Net             -925 ml    Intake/Output this shift: Total I/O In: 120 [P.O.:120] Out: -   Labs:  Recent Labs  02/22/17 0551 02/23/17 0600  HGB 11.4* 9.5*    Recent Labs  02/22/17 0551 02/23/17 0600  WBC 13.4* 11.7*  RBC 3.97 3.26*  HCT 35.1* 28.4*  PLT 255 215    Recent Labs  02/22/17 0551 02/23/17 0600  NA 142 142  K 4.6 3.6  CL 109 108  CO2 25 26  BUN 13 12  CREATININE 0.69 0.66  GLUCOSE 120* 99  CALCIUM 8.9 8.4*    EXAM General - Patient is Alert and Oriented Extremity - Neurologically intact Intact pulses distally Dorsiflexion/Plantar flexion intact No cellulitis present Compartment soft Dressing/Incision - clean, dry, no drainage Motor Function - intact, moving foot and toes well on exam.   Past Medical History:  Diagnosis Date  . Arthritis   . Cancer (HCC)    basal cell skin ca    Assessment/Plan: 2 Days Post-Op Procedure(s) (LRB): RIGHT TOTAL KNEE ARTHROPLASTY (Right) Principal Problem:   OA (osteoarthritis) of knee  Estimated body mass index is 19.39 kg/m as calculated from the following:   Height as of this  encounter: 5\' 2"  (1.575 m).   Weight as of this encounter: 48.1 kg (106 lb). Advance diet Up with therapy  DVT Prophylaxis - Xarelto Weight-Bearing as tolerated   Continue therapy today. Plan for DC home early afternoon. Will start VERA therapy at home. Added iron due to drop in Hgb.  Ardeen Jourdain, PA-C Orthopaedic Surgery 02/23/2017, 11:10 AM

## 2017-02-23 NOTE — Progress Notes (Signed)
Physical Therapy Treatment Patient Details Name: Veronica Ho MRN: [id number] DOB: Aug 06, 1951 Today's Date: 02/23/2017    History of Present Illness s/p R TKA    PT Comments    POD # 2 Instructed pt how to cross and use opposite LE to self assist R LE on/off bed.  Assisted with amb and performed all supine TKR TE's following HEP handout.  Instructed that VERA is primary and handout is a back up.  Instructed on use of ICE.  Addressed all mobility questions.  Pt ready for D/C to home.   Follow Up Recommendations   (VERA)     Equipment Recommendations  None recommended by PT    Recommendations for Other Services       Precautions / Restrictions Precautions Precautions: Fall Precaution Comments: IND SLRs--KI not used Restrictions Weight Bearing Restrictions: No RLE Weight Bearing: Weight bearing as tolerated    Mobility  Bed Mobility Overal bed mobility: Modified Independent             General bed mobility comments: demonstrated and instructed pt on how to use other LE to assist  Transfers Overall transfer level: Needs assistance Equipment used: Rolling walker (2 wheeled) Transfers: Sit to/from [facility] Sit to Stand: Supervision Stand pivot transfers: Supervision       General transfer comment: <25% cues for hand placement and safety with turns  Ambulation/Gait Ambulation/Gait assistance: Supervision;Min guard Ambulation Distance (Feet): 75 Feet Assistive device: Rolling walker (2 wheeled) Gait Pattern/deviations: Step-to pattern;Antalgic Gait velocity: decreased   General Gait Details: <25% cues for sequence and RW distance from self   Stairs Stairs:  (no stairs at home/apt with elevator)          Wheelchair Mobility    Modified Rankin (Stroke Patients Only)       Balance                                            Cognition Arousal/Alertness: Awake/alert Behavior During Therapy: WFL for tasks  assessed/performed Overall Cognitive Status: Within Functional Limits for tasks assessed                                        Exercises   Total Knee Replacement TE's 10 reps B LE ankle pumps 10 reps towel squeezes 10 reps knee presses 10 reps heel slides  10 reps SAQ's 10 reps SLR's 10 reps ABD Followed by ICE     General Comments        Pertinent Vitals/Pain      Home Living                      Prior Function            PT Goals (current goals can now be found in the care plan section) Progress towards PT goals: Progressing toward goals    Frequency    7X/week      PT Plan Current plan remains appropriate    Co-evaluation              AM-PAC PT "6 Clicks" Daily Activity  Outcome Measure  Difficulty turning over in bed (including adjusting bedclothes, sheets and blankets)?: A Little Difficulty moving from lying on back to sitting on the side of the bed? :  A Little Difficulty sitting down on and standing up from a chair with arms (e.g., wheelchair, bedside commode, etc,.)?: A Little Help needed moving to and from a bed to chair (including a wheelchair)?: A Little Help needed walking in hospital room?: A Little Help needed climbing 3-5 steps with a railing? : A Little 6 Click Score: 18    End of Session Equipment Utilized During Treatment: Gait belt Activity Tolerance: Patient tolerated treatment well Patient left: in bed;with call bell/phone within reach;with bed alarm set Nurse Communication:  (pt ready for D/C to home after one session) PT Visit Diagnosis: Difficulty in walking, not elsewhere classified (R26.2);Pain     Time: 8850-2774 PT Time Calculation (min) (ACUTE ONLY): 28 min  Charges:  $Gait Training: 8-22 mins $Therapeutic Exercise: 8-22 mins                    G Codes:       Rica Koyanagi  PTA [facility]  Acute  Rehab Pager      [phone]

## 2017-02-23 NOTE — Progress Notes (Signed)
Occupational Therapy Treatment Patient Details Name: Veronica Ho MRN: [id number] DOB: 1951-03-18 Today's Date: 02/23/2017    History of present illness s/p R TKA   OT comments  OT education complete  Follow Up Recommendations  No OT follow up;Supervision - Intermittent    Equipment Recommendations  3 in 1 bedside commode    Recommendations for Other Services      Precautions / Restrictions Precautions Precautions: Fall Precaution Comments: IND SLRs--KI not used Restrictions Weight Bearing Restrictions: No RLE Weight Bearing: Weight bearing as tolerated       Mobility Bed Mobility Overal bed mobility: Modified Independent             General bed mobility comments: demonstrated and instructed pt on how to use other LE to assist  Transfers Overall transfer level: Needs assistance Equipment used: Rolling walker (2 wheeled) Transfers: Sit to/from [facility] Sit to Stand: Supervision Stand pivot transfers: Supervision              ADL either performed or assessed with clinical judgement   ADL       Grooming: Set up;Standing   Upper Body Bathing: Set up;Sitting   Lower Body Bathing: Supervison/ safety;Sit to/from stand   Upper Body Dressing : Set up;Sitting   Lower Body Dressing: Supervision/safety;Sit to/from stand   Toilet Transfer: Supervision/safety;RW;Ambulation;Cueing for safety   Toileting- Water quality scientist and Hygiene: Supervision/safety;Sit to/from stand       Functional mobility during ADLs: Min guard;Rolling walker;Cueing for safety;Cueing for sequencing       Vision Patient Visual Report: No change from baseline     Perception     Praxis      Cognition Arousal/Alertness: Awake/alert Behavior During Therapy: WFL for tasks assessed/performed Overall Cognitive Status: Within Functional Limits for tasks assessed                                                General Comments       Pertinent Vitals/ Pain       Pain Location: right knee Pain Descriptors / Indicators: Aching;Discomfort Pain Intervention(s): Limited activity within patient's tolerance     Prior Functioning/Environment              Frequency  Min 2X/week        Progress Toward Goals  OT Goals(current goals can now be found in the care plan section)  Progress towards OT goals: Progressing toward goals  Acute Rehab OT Goals Patient Stated Goal: home and get better  Plan Discharge plan remains appropriate       AM-PAC PT "6 Clicks" Daily Activity     Outcome Measure   Help from another person eating meals?: None Help from another person taking care of personal grooming?: None Help from another person toileting, which includes using toliet, bedpan, or urinal?: A Little Help from another person bathing (including washing, rinsing, drying)?: A Little Help from another person to put on and taking off regular upper body clothing?: None Help from another person to put on and taking off regular lower body clothing?: A Little 6 Click Score: 21    End of Session Equipment Utilized During Treatment: Gait belt;Rolling walker  OT Visit Diagnosis: Other abnormalities of gait and mobility (R26.89);Pain Pain - Right/Left: Right Pain - part of body: Knee   Activity Tolerance Patient tolerated treatment well  Patient Left in chair   Nurse Communication Mobility status        Time: 1517-6160 OT Time Calculation (min): 25 min  Charges: OT General Charges $OT Visit: 1 Procedure OT Treatments $Self Care/Home Management : 23-37 mins  Clayton, Pickett   Betsy Pries 02/23/2017, 1:16 PM

## 2017-03-02 DIAGNOSIS — M25661 Stiffness of right knee, not elsewhere classified: Secondary | ICD-10-CM | POA: Diagnosis not present

## 2017-03-08 DIAGNOSIS — M1711 Unilateral primary osteoarthritis, right knee: Secondary | ICD-10-CM | POA: Diagnosis not present

## 2017-03-29 DIAGNOSIS — M1711 Unilateral primary osteoarthritis, right knee: Secondary | ICD-10-CM | POA: Diagnosis not present

## 2017-03-29 DIAGNOSIS — Z96651 Presence of right artificial knee joint: Secondary | ICD-10-CM | POA: Diagnosis not present

## 2017-03-29 DIAGNOSIS — Z471 Aftercare following joint replacement surgery: Secondary | ICD-10-CM | POA: Diagnosis not present

## 2017-04-13 DIAGNOSIS — M25661 Stiffness of right knee, not elsewhere classified: Secondary | ICD-10-CM | POA: Diagnosis not present

## 2017-04-18 DIAGNOSIS — M25661 Stiffness of right knee, not elsewhere classified: Secondary | ICD-10-CM | POA: Diagnosis not present

## 2017-04-19 DIAGNOSIS — H2513 Age-related nuclear cataract, bilateral: Secondary | ICD-10-CM | POA: Diagnosis not present

## 2017-04-19 DIAGNOSIS — H524 Presbyopia: Secondary | ICD-10-CM | POA: Diagnosis not present

## 2017-04-19 DIAGNOSIS — H01002 Unspecified blepharitis right lower eyelid: Secondary | ICD-10-CM | POA: Diagnosis not present

## 2017-04-19 DIAGNOSIS — H01004 Unspecified blepharitis left upper eyelid: Secondary | ICD-10-CM | POA: Diagnosis not present

## 2017-04-19 DIAGNOSIS — H01005 Unspecified blepharitis left lower eyelid: Secondary | ICD-10-CM | POA: Diagnosis not present

## 2017-04-19 DIAGNOSIS — H01001 Unspecified blepharitis right upper eyelid: Secondary | ICD-10-CM | POA: Diagnosis not present

## 2017-04-20 DIAGNOSIS — M25661 Stiffness of right knee, not elsewhere classified: Secondary | ICD-10-CM | POA: Diagnosis not present

## 2017-04-25 DIAGNOSIS — M25661 Stiffness of right knee, not elsewhere classified: Secondary | ICD-10-CM | POA: Diagnosis not present

## 2017-04-27 DIAGNOSIS — M25661 Stiffness of right knee, not elsewhere classified: Secondary | ICD-10-CM | POA: Diagnosis not present

## 2017-05-02 DIAGNOSIS — M25661 Stiffness of right knee, not elsewhere classified: Secondary | ICD-10-CM | POA: Diagnosis not present

## 2017-05-03 DIAGNOSIS — M1711 Unilateral primary osteoarthritis, right knee: Secondary | ICD-10-CM | POA: Diagnosis not present

## 2017-05-06 DIAGNOSIS — M25661 Stiffness of right knee, not elsewhere classified: Secondary | ICD-10-CM | POA: Diagnosis not present

## 2017-05-09 DIAGNOSIS — M25661 Stiffness of right knee, not elsewhere classified: Secondary | ICD-10-CM | POA: Diagnosis not present

## 2017-05-10 ENCOUNTER — Ambulatory Visit: Payer: Self-pay | Admitting: Orthopedic Surgery

## 2017-05-10 DIAGNOSIS — D485 Neoplasm of uncertain behavior of skin: Secondary | ICD-10-CM | POA: Diagnosis not present

## 2017-05-10 DIAGNOSIS — G8929 Other chronic pain: Secondary | ICD-10-CM | POA: Diagnosis not present

## 2017-05-10 NOTE — Progress Notes (Signed)
Please place orders in EPIC as patient is being scheduled for a pre-op appointment! Thank you! 

## 2017-05-12 DIAGNOSIS — L718 Other rosacea: Secondary | ICD-10-CM | POA: Diagnosis not present

## 2017-05-12 DIAGNOSIS — L821 Other seborrheic keratosis: Secondary | ICD-10-CM | POA: Diagnosis not present

## 2017-05-12 DIAGNOSIS — L905 Scar conditions and fibrosis of skin: Secondary | ICD-10-CM | POA: Diagnosis not present

## 2017-05-12 DIAGNOSIS — L570 Actinic keratosis: Secondary | ICD-10-CM | POA: Diagnosis not present

## 2017-05-13 ENCOUNTER — Ambulatory Visit: Payer: Self-pay | Admitting: Orthopedic Surgery

## 2017-05-13 DIAGNOSIS — M25661 Stiffness of right knee, not elsewhere classified: Secondary | ICD-10-CM | POA: Diagnosis not present

## 2017-05-19 ENCOUNTER — Encounter (HOSPITAL_COMMUNITY): Payer: Self-pay | Admitting: *Deleted

## 2017-05-26 NOTE — Progress Notes (Signed)
Spoke with patient and informed her of time change for surgery. Patient  instructed to arrive at Admitting at 115 am at [facility] and may have clear liquids from midnight up until 0745 am then nothing until after surgery. Patient verbalized understanding.

## 2017-05-27 NOTE — Progress Notes (Signed)
Spoke to patient about new time change for surgery 05/30/2017. Patient instructed to arrive at Admitting at 1155 am at [facility] and may have clear liquids from midnight up until 0825 am then nothing until after surgery. Patient verbalized understanding.

## 2017-05-30 ENCOUNTER — Ambulatory Visit (HOSPITAL_COMMUNITY)
Admission: RE | Admit: 2017-05-30 | Discharge: 2017-05-30 | Disposition: A | Discharge status: Home / Self Care | Payer: Medicare Other | Source: Ambulatory Visit | Attending: Orthopedic Surgery | Admitting: Orthopedic Surgery

## 2017-05-30 ENCOUNTER — Encounter (HOSPITAL_COMMUNITY)
Admission: RE | Disposition: A | Discharge status: Home / Self Care | Payer: Self-pay | Source: Ambulatory Visit | Attending: Orthopedic Surgery

## 2017-05-30 ENCOUNTER — Encounter (HOSPITAL_COMMUNITY): Payer: Self-pay | Admitting: *Deleted

## 2017-05-30 ENCOUNTER — Ambulatory Visit (HOSPITAL_COMMUNITY): Payer: Medicare Other | Admitting: Anesthesiology

## 2017-05-30 DIAGNOSIS — T84092A Other mechanical complication of internal right knee prosthesis, initial encounter: Secondary | ICD-10-CM | POA: Diagnosis not present

## 2017-05-30 DIAGNOSIS — Z7982 Long term (current) use of aspirin: Secondary | ICD-10-CM | POA: Insufficient documentation

## 2017-05-30 DIAGNOSIS — M25661 Stiffness of right knee, not elsewhere classified: Secondary | ICD-10-CM | POA: Diagnosis not present

## 2017-05-30 DIAGNOSIS — Z85828 Personal history of other malignant neoplasm of skin: Secondary | ICD-10-CM | POA: Diagnosis not present

## 2017-05-30 DIAGNOSIS — Z7901 Long term (current) use of anticoagulants: Secondary | ICD-10-CM | POA: Insufficient documentation

## 2017-05-30 DIAGNOSIS — Z96651 Presence of right artificial knee joint: Secondary | ICD-10-CM | POA: Insufficient documentation

## 2017-05-30 DIAGNOSIS — M1990 Unspecified osteoarthritis, unspecified site: Secondary | ICD-10-CM | POA: Insufficient documentation

## 2017-05-30 DIAGNOSIS — Z79899 Other long term (current) drug therapy: Secondary | ICD-10-CM | POA: Diagnosis not present

## 2017-05-30 DIAGNOSIS — M24661 Ankylosis, right knee: Secondary | ICD-10-CM | POA: Diagnosis not present

## 2017-05-30 DIAGNOSIS — T8482XA Fibrosis due to internal orthopedic prosthetic devices, implants and grafts, initial encounter: Secondary | ICD-10-CM

## 2017-05-30 HISTORY — PX: KNEE CLOSED REDUCTION: SHX995

## 2017-05-30 LAB — CBC
HCT: 39.2 % (ref 36.0–46.0)
Hemoglobin: 12.6 g/dL (ref 12.0–15.0)
MCH: 27 pg (ref 26.0–34.0)
MCHC: 32.1 g/dL (ref 30.0–36.0)
MCV: 84.1 fL (ref 78.0–100.0)
PLATELETS: 303 10*3/uL (ref 150–400)
RBC: 4.66 MIL/uL (ref 3.87–5.11)
RDW: 14.5 % (ref 11.5–15.5)
WBC: 10.7 10*3/uL — ABNORMAL HIGH (ref 4.0–10.5)

## 2017-05-30 SURGERY — MANIPULATION, KNEE, CLOSED
Anesthesia: General | Site: Knee | Laterality: Right

## 2017-05-30 MED ORDER — OXYCODONE HCL 5 MG/5ML PO SOLN: 5.0000 mg | Freq: Once | ORAL | Status: DC | PRN

## 2017-05-30 MED ORDER — OXYCODONE HCL 5 MG PO TABS
10.0000 mg | ORAL_TABLET | Freq: Once | ORAL | Status: AC | PRN
  Administered 2017-05-30: 10 mg via ORAL

## 2017-05-30 MED ORDER — DEXAMETHASONE SODIUM PHOSPHATE 10 MG/ML IJ SOLN: 10.0000 mg | Freq: Once | INTRAMUSCULAR | Status: DC

## 2017-05-30 MED ORDER — FENTANYL CITRATE (PF) 100 MCG/2ML IJ SOLN: 25.0000 ug | INTRAMUSCULAR | Status: DC | PRN

## 2017-05-30 MED ORDER — FENTANYL CITRATE (PF) 100 MCG/2ML IJ SOLN
50.0000 ug | INTRAMUSCULAR | Status: DC | PRN
  Administered 2017-05-30: 50 ug via INTRAVENOUS

## 2017-05-30 MED ORDER — PROPOFOL 10 MG/ML IV BOLUS
INTRAVENOUS | Status: AC
  Filled 2017-05-30: qty 20

## 2017-05-30 MED ORDER — ACETAMINOPHEN 10 MG/ML IV SOLN
1000.0000 mg | Freq: Once | INTRAVENOUS | Status: AC
  Administered 2017-05-30: 1000 mg via INTRAVENOUS

## 2017-05-30 MED ORDER — LACTATED RINGERS IV SOLN
INTRAVENOUS | Status: DC
  Administered 2017-05-30: 13:00:00 via INTRAVENOUS

## 2017-05-30 MED ORDER — OXYCODONE HCL 5 MG/5ML PO SOLN: 5.0000 mg | Freq: Once | ORAL | Status: AC | PRN

## 2017-05-30 MED ORDER — OXYCODONE HCL 5 MG PO TABS: 5.0000 mg | ORAL_TABLET | Freq: Once | ORAL | Status: DC | PRN

## 2017-05-30 MED ORDER — OXYCODONE HCL 5 MG PO TABS: 5.0000 mg | ORAL_TABLET | Freq: Four times a day (QID) | ORAL | 0 refills | Status: DC | PRN

## 2017-05-30 MED ORDER — OXYCODONE HCL 5 MG PO TABS
ORAL_TABLET | ORAL | Status: AC
  Filled 2017-05-30: qty 2

## 2017-05-30 MED ORDER — ONDANSETRON HCL 4 MG/2ML IJ SOLN
INTRAMUSCULAR | Status: AC
  Filled 2017-05-30: qty 2

## 2017-05-30 MED ORDER — FENTANYL CITRATE (PF) 100 MCG/2ML IJ SOLN
INTRAMUSCULAR | Status: AC
  Administered 2017-05-30: 50 ug via INTRAVENOUS
  Filled 2017-05-30: qty 2

## 2017-05-30 MED ORDER — PROPOFOL 10 MG/ML IV BOLUS
INTRAVENOUS | Status: DC | PRN
  Administered 2017-05-30: 120 mg via INTRAVENOUS

## 2017-05-30 MED ORDER — ACETAMINOPHEN 10 MG/ML IV SOLN
INTRAVENOUS | Status: AC
  Filled 2017-05-30: qty 100

## 2017-05-30 MED ORDER — CHLORHEXIDINE GLUCONATE 4 % EX LIQD: 60.0000 mL | Freq: Once | CUTANEOUS | Status: DC

## 2017-05-30 MED ORDER — SODIUM CHLORIDE 0.9 % IV SOLN: INTRAVENOUS | Status: DC

## 2017-05-30 MED ORDER — LIDOCAINE HCL (CARDIAC) 20 MG/ML IV SOLN
INTRAVENOUS | Status: DC | PRN
  Administered 2017-05-30: 50 mg via INTRATRACHEAL

## 2017-05-30 MED ORDER — DEXAMETHASONE SODIUM PHOSPHATE 10 MG/ML IJ SOLN
INTRAMUSCULAR | Status: DC | PRN
  Administered 2017-05-30: 10 mg via INTRAVENOUS

## 2017-05-30 MED ORDER — FENTANYL CITRATE (PF) 100 MCG/2ML IJ SOLN
INTRAMUSCULAR | Status: AC
  Filled 2017-05-30: qty 2

## 2017-05-30 MED ORDER — FENTANYL CITRATE (PF) 100 MCG/2ML IJ SOLN
INTRAMUSCULAR | Status: DC | PRN
Start: 2017-05-30 — End: 2017-05-30
  Administered 2017-05-30: 50 ug via INTRAVENOUS

## 2017-05-30 MED ORDER — ONDANSETRON HCL 4 MG/2ML IJ SOLN
INTRAMUSCULAR | Status: DC | PRN
  Administered 2017-05-30: 4 mg via INTRAVENOUS

## 2017-05-30 NOTE — Anesthesia Procedure Notes (Signed)
Procedure Name: General with mask airway Date/Time: 05/30/2017 2:23 PM Performed by: Glory Buff Pre-anesthesia Checklist: Patient identified, Emergency Drugs available, Suction available and Patient being monitored Patient Re-evaluated:Patient Re-evaluated prior to induction Oxygen Delivery Method: Circle system utilized Preoxygenation: Pre-oxygenation with 100% oxygen Induction Type: IV induction Ventilation: Mask ventilation without difficulty Placement Confirmation: positive ETCO2 Dental Injury: Teeth and Oropharynx as per pre-operative assessment

## 2017-05-30 NOTE — Transfer of Care (Signed)
Immediate Anesthesia Transfer of Care Note  Patient: Veronica Ho  Procedure(s) Performed: CLOSED MANIPULATION RIGHT KNEE (Right Knee)  Patient Location: PACU  Anesthesia Type:General  Level of Consciousness: awake, alert  and oriented  Airway & Oxygen Therapy: Patient Spontanous Breathing and Patient connected to face mask oxygen  Post-op Assessment: Report given to RN and Post -op Vital signs reviewed and stable  Post vital signs: Reviewed and stable  Last Vitals:  Vitals:   05/30/17 1338 05/30/17 1357  BP:    Pulse: (!) 52 (!) 58  Resp:    Temp:    SpO2: 98% 97%    Last Pain:  Vitals:   05/30/17 1357  TempSrc:   PainSc: 5       Patients Stated Pain Goal: 5 (22/97/98 9211)  Complications: No apparent anesthesia complications

## 2017-05-30 NOTE — Anesthesia Preprocedure Evaluation (Signed)
Anesthesia Evaluation  Patient identified by MRN, date of birth, ID band Patient awake    Reviewed: Allergy & Precautions, NPO status , Patient's Chart, lab work & pertinent test results  History of Anesthesia Complications Negative for: history of anesthetic complications  Airway Mallampati: I  TM Distance: >3 FB Neck ROM: Full    Dental  (+) Teeth Intact   Pulmonary neg pulmonary ROS,    breath sounds clear to auscultation       Cardiovascular negative cardio ROS   Rhythm:Regular     Neuro/Psych negative neurological ROS  negative psych ROS   GI/Hepatic negative GI ROS, Neg liver ROS,   Endo/Other  negative endocrine ROS  Renal/GU negative Renal ROS     Musculoskeletal  (+) Arthritis ,   Abdominal   Peds  Hematology negative hematology ROS (+)   Anesthesia Other Findings   Reproductive/Obstetrics                             Anesthesia Physical Anesthesia Plan  ASA: I  Anesthesia Plan: General   Post-op Pain Management:    Induction: Intravenous  PONV Risk Score and Plan: 3 and Ondansetron and Dexamethasone  Airway Management Planned: Mask  Additional Equipment: None  Intra-op Plan:   Post-operative Plan:   Informed Consent: I have reviewed the patients History and Physical, chart, labs and discussed the procedure including the risks, benefits and alternatives for the proposed anesthesia with the patient or authorized representative who has indicated his/her understanding and acceptance.   Dental advisory given  Plan Discussed with: CRNA and Surgeon  Anesthesia Plan Comments:         Anesthesia Quick Evaluation

## 2017-05-30 NOTE — Op Note (Signed)
  OPERATIVE REPORT   PREOPERATIVE DIAGNOSIS: Arthrofibrosis, Right  knee.   POSTOPERATIVE DIAGNOSIS: Arthrofibrosis, Right knee.   PROCEDURE:  Right  knee closed manipulation.   SURGEON: Gaynelle Arabian, MD   ASSISTANT: None.   ANESTHESIA: General.   COMPLICATIONS: None.   CONDITION: Stable to Recovery.   Pre-manipulation range of motion is 0-90.  Post-manipulation range of  Motion is 0-135  PROCEDURE IN DETAIL: After successful administration of general  anesthetic, exam under anesthesia was performed showing range of motion  0-90 degrees. I then placed my chest against the proximal tibia,  flexing the knee with audible lysis of adhesions. I was easily able to  get the knee flexed to 135  degrees. I then put the knee back in extension and with some  patellar manipulation and gentle pressure got to full  Extension.The patient was subsequently awakened and transported to Recovery in  stable condition.

## 2017-05-30 NOTE — H&P (Signed)
CC- Veronica Ho is a 66 y.o. female who presents with right knee stiffness.  HPI- . Knee Pain: Patient presents with stiffness involving the  right knee. Onset of the symptoms was several months ago. Inciting event: She had a right total knee arthroplasty in 02/2017 and has had difficulty gaining flexion despite adequate physical therapy. She is unable to consistently get over 90 degrees of flexion and has not improved over the past month. She presents now for closed manipulation right knee.  Past Medical History:  Diagnosis Date  . Arthritis   . Cancer ([place])    basal cell skin ca    Past Surgical History:  Procedure Laterality Date  . JOINT REPLACEMENT     Left knee 2015 in [place] [facility]   . TOTAL KNEE ARTHROPLASTY Right 02/21/2017   Procedure: RIGHT TOTAL KNEE ARTHROPLASTY;  Surgeon: Gaynelle Arabian, MD;  Location: [facility];  Service: Orthopedics;  Laterality: Right;  With adductor canal block.     Prior to Admission medications   Medication Sig Start Date End Date Taking? Authorizing Provider  acetaminophen (TYLENOL) 500 MG tablet Take 1,000 mg by mouth every 6 (six) hours as needed (for pain.).    Yes [provider]  ALPRAZolam Duanne Moron) 0.5 MG tablet Take 0.5-1 tablets (0.25-0.5 mg total) by mouth at bedtime. Patient taking differently: Take 0.5 mg by mouth at bedtime.  02/23/17  Yes Constable, Amber, PA-C  aspirin EC 81 MG tablet Take 81 mg by mouth daily.   Yes [provider]  Biotin 2500 MCG CAPS Take 1 tablet by mouth daily.   Yes [provider]  Multiple Vitamin (MULTIVITAMIN WITH MINERALS) TABS tablet Take 1 tablet by mouth daily.   Yes [provider]  oxyCODONE (OXY IR/ROXICODONE) 5 MG immediate release tablet Take 1-2 tablets (5-10 mg total) by mouth every 3 (three) hours as needed for breakthrough pain. Patient taking differently: Take 5-10 mg by mouth 4 (four) times daily as needed for moderate pain or breakthrough pain.   02/22/17  Yes Constable, Amber, PA-C  Vitamins-Lipotropics (LIPO-FLAVONOID PLUS PO) Take 2 tablets by mouth daily.   Yes [provider]  ferrous sulfate 325 (65 FE) MG tablet Take 1 tablet (325 mg total) by mouth daily with breakfast. Patient not taking: Reported on 05/19/2017 02/23/17   Ardeen Jourdain, PA-C  rivaroxaban (XARELTO) 10 MG TABS tablet Take 1 tablet (10 mg total) by mouth daily with breakfast. Patient not taking: Reported on 05/19/2017 02/23/17   Ardeen Jourdain, PA-C  tiZANidine (ZANAFLEX) 4 MG tablet Take 1 tablet (4 mg total) by mouth every 8 (eight) hours as needed for muscle spasms. Patient not taking: Reported on 05/19/2017 02/22/17 02/22/18  Ardeen Jourdain, PA-C  traMADol (ULTRAM) 50 MG tablet Take 1-2 tablets (50-100 mg total) by mouth every 6 (six) hours as needed for moderate pain. Patient not taking: Reported on 05/19/2017 02/22/17   Ardeen Jourdain, PA-C   KNEE EXAM antalgic gait, No warmth or effusion, reduced range of motion (5-90 degrees), collateral ligaments intact  Physical Examination: General appearance - alert, well appearing, and in no distress Mental status - alert, oriented to person, place, and time Chest - clear to auscultation, no wheezes, rales or rhonchi, symmetric air entry Heart - normal rate, regular rhythm, normal S1, S2, no murmurs, rubs, clicks or gallops Abdomen - soft, nontender, nondistended, no masses or organomegaly Neurological - alert, oriented, normal speech, no focal findings or movement disorder noted   Asessment/Plan--- Right knee arthrofibrosisr- -  Plan right knee closed manipulation. Procedure risks and potential comps discussed with patient who elects to proceed. Goals are decreased pain and increased function with a high likelihood of achieving both

## 2017-05-30 NOTE — Discharge Instructions (Signed)
Resume your physical therapy tomorrow  Resume activities as tolerated  I was able to flex your knee 135 degrees

## 2017-05-31 DIAGNOSIS — M25661 Stiffness of right knee, not elsewhere classified: Secondary | ICD-10-CM | POA: Diagnosis not present

## 2017-06-01 DIAGNOSIS — M25661 Stiffness of right knee, not elsewhere classified: Secondary | ICD-10-CM | POA: Diagnosis not present

## 2017-06-01 NOTE — Anesthesia Postprocedure Evaluation (Signed)
Anesthesia Post Note  Patient: Veronica Ho  Procedure(s) Performed: CLOSED MANIPULATION RIGHT KNEE (Right Knee)     Patient location during evaluation: PACU Anesthesia Type: General Level of consciousness: awake and alert Pain management: pain level controlled Vital Signs Assessment: post-procedure vital signs reviewed and stable Respiratory status: spontaneous breathing, nonlabored ventilation, respiratory function stable and patient connected to nasal cannula oxygen Cardiovascular status: blood pressure returned to baseline and stable Postop Assessment: no apparent nausea or vomiting Anesthetic complications: no    Last Vitals:  Vitals:   05/30/17 1528 05/30/17 1602  BP: (!) 138/91 130/62  Pulse: (!) 53 62  Resp: 16 16  Temp: 36.7 C   SpO2: 99% 100%    Last Pain:  Vitals:   05/30/17 1602  TempSrc:   PainSc: 6                  Leonilda Cozby

## 2017-06-03 DIAGNOSIS — M25661 Stiffness of right knee, not elsewhere classified: Secondary | ICD-10-CM | POA: Diagnosis not present

## 2017-06-06 DIAGNOSIS — M25561 Pain in right knee: Secondary | ICD-10-CM | POA: Diagnosis not present

## 2017-06-06 DIAGNOSIS — Z23 Encounter for immunization: Secondary | ICD-10-CM | POA: Diagnosis not present

## 2017-06-06 DIAGNOSIS — Z808 Family history of malignant neoplasm of other organs or systems: Secondary | ICD-10-CM | POA: Diagnosis not present

## 2017-06-06 DIAGNOSIS — G4709 Other insomnia: Secondary | ICD-10-CM | POA: Diagnosis not present

## 2017-06-06 DIAGNOSIS — Z0000 Encounter for general adult medical examination without abnormal findings: Secondary | ICD-10-CM | POA: Diagnosis not present

## 2017-06-06 DIAGNOSIS — R5383 Other fatigue: Secondary | ICD-10-CM | POA: Diagnosis not present

## 2017-06-06 DIAGNOSIS — M25661 Stiffness of right knee, not elsewhere classified: Secondary | ICD-10-CM | POA: Diagnosis not present

## 2017-06-06 DIAGNOSIS — E7849 Other hyperlipidemia: Secondary | ICD-10-CM | POA: Diagnosis not present

## 2017-06-06 DIAGNOSIS — Z681 Body mass index (BMI) 19 or less, adult: Secondary | ICD-10-CM | POA: Diagnosis not present

## 2017-06-06 DIAGNOSIS — Z1389 Encounter for screening for other disorder: Secondary | ICD-10-CM | POA: Diagnosis not present

## 2017-06-08 DIAGNOSIS — M25661 Stiffness of right knee, not elsewhere classified: Secondary | ICD-10-CM | POA: Diagnosis not present

## 2017-06-10 DIAGNOSIS — M25661 Stiffness of right knee, not elsewhere classified: Secondary | ICD-10-CM | POA: Diagnosis not present

## 2017-06-13 DIAGNOSIS — M25661 Stiffness of right knee, not elsewhere classified: Secondary | ICD-10-CM | POA: Diagnosis not present

## 2017-06-14 DIAGNOSIS — M1711 Unilateral primary osteoarthritis, right knee: Secondary | ICD-10-CM | POA: Diagnosis not present

## 2017-06-15 DIAGNOSIS — M25661 Stiffness of right knee, not elsewhere classified: Secondary | ICD-10-CM | POA: Diagnosis not present

## 2017-06-17 DIAGNOSIS — M25661 Stiffness of right knee, not elsewhere classified: Secondary | ICD-10-CM | POA: Diagnosis not present

## 2017-06-20 DIAGNOSIS — M25661 Stiffness of right knee, not elsewhere classified: Secondary | ICD-10-CM | POA: Diagnosis not present

## 2017-06-21 DIAGNOSIS — C44629 Squamous cell carcinoma of skin of left upper limb, including shoulder: Secondary | ICD-10-CM | POA: Diagnosis not present

## 2017-06-21 DIAGNOSIS — D485 Neoplasm of uncertain behavior of skin: Secondary | ICD-10-CM | POA: Diagnosis not present

## 2017-06-21 DIAGNOSIS — D492 Neoplasm of unspecified behavior of bone, soft tissue, and skin: Secondary | ICD-10-CM | POA: Diagnosis not present

## 2017-06-22 ENCOUNTER — Encounter: Payer: Self-pay | Admitting: Podiatry

## 2017-06-22 ENCOUNTER — Ambulatory Visit (INDEPENDENT_AMBULATORY_CARE_PROVIDER_SITE_OTHER): Payer: Medicare Other | Admitting: Podiatry

## 2017-06-22 DIAGNOSIS — C44629 Squamous cell carcinoma of skin of left upper limb, including shoulder: Secondary | ICD-10-CM | POA: Diagnosis not present

## 2017-06-22 DIAGNOSIS — L84 Corns and callosities: Secondary | ICD-10-CM

## 2017-06-22 DIAGNOSIS — M25661 Stiffness of right knee, not elsewhere classified: Secondary | ICD-10-CM | POA: Diagnosis not present

## 2017-06-22 NOTE — Progress Notes (Signed)
   Subjective:    Patient ID: Veronica Ho, female    DOB: [birth year], 66 y.o.   MRN: [id number]  HPI    Review of Systems  All other systems reviewed and are negative.      Objective:   Physical Exam        Assessment & Plan:

## 2017-06-22 NOTE — Progress Notes (Signed)
Subjective:    Patient ID: Veronica Ho, female   DOB: 66 y.o.   MRN: [id number]   HPI patient presents with painful calluses and states that she had hip surgery and cannot reach her feet and they're making it hard for her to walk    Review of Systems  All other systems reviewed and are negative.       Objective:  Physical Exam  Constitutional: She appears well-developed and well-nourished.  Cardiovascular: Intact distal pulses.  Musculoskeletal: Normal range of motion.  Neurological: She is alert.  Skin: Skin is warm.  Nursing note and vitals reviewed.  patient does not smoke does not drink and was noted to have severe keratotic lesion sub-fifth metatarsals bilateral and hallux bilateral that are painful when pressed and make walking difficult. Found have good digital perfusion well oriented 3     Assessment:   Structural deformity with keratotic lesion formation and callus of the painful nature      Plan:    H&P condition reviewed and debrided lesions on both feet with no iatrogenic bleeding noted. Explained the causes of these types deformities and possibility for orthotics in future

## 2017-06-24 DIAGNOSIS — M25661 Stiffness of right knee, not elsewhere classified: Secondary | ICD-10-CM | POA: Diagnosis not present

## 2017-06-27 DIAGNOSIS — M25661 Stiffness of right knee, not elsewhere classified: Secondary | ICD-10-CM | POA: Diagnosis not present

## 2017-06-29 DIAGNOSIS — M25661 Stiffness of right knee, not elsewhere classified: Secondary | ICD-10-CM | POA: Diagnosis not present

## 2017-07-04 DIAGNOSIS — M25661 Stiffness of right knee, not elsewhere classified: Secondary | ICD-10-CM | POA: Diagnosis not present

## 2017-07-05 DIAGNOSIS — D485 Neoplasm of uncertain behavior of skin: Secondary | ICD-10-CM | POA: Diagnosis not present

## 2017-07-05 DIAGNOSIS — D1723 Benign lipomatous neoplasm of skin and subcutaneous tissue of right leg: Secondary | ICD-10-CM | POA: Diagnosis not present

## 2017-07-05 DIAGNOSIS — C44629 Squamous cell carcinoma of skin of left upper limb, including shoulder: Secondary | ICD-10-CM | POA: Diagnosis not present

## 2017-07-08 DIAGNOSIS — M25661 Stiffness of right knee, not elsewhere classified: Secondary | ICD-10-CM | POA: Diagnosis not present

## 2017-07-12 DIAGNOSIS — M1711 Unilateral primary osteoarthritis, right knee: Secondary | ICD-10-CM | POA: Diagnosis not present

## 2017-07-19 DIAGNOSIS — Z800 Family history of malignant neoplasm of digestive organs: Secondary | ICD-10-CM | POA: Diagnosis not present

## 2017-07-19 DIAGNOSIS — K5730 Diverticulosis of large intestine without perforation or abscess without bleeding: Secondary | ICD-10-CM | POA: Diagnosis not present

## 2017-07-28 DIAGNOSIS — M205X9 Other deformities of toe(s) (acquired), unspecified foot: Secondary | ICD-10-CM | POA: Diagnosis not present

## 2017-07-28 DIAGNOSIS — M2040 Other hammer toe(s) (acquired), unspecified foot: Secondary | ICD-10-CM | POA: Diagnosis not present

## 2017-07-28 DIAGNOSIS — M2010 Hallux valgus (acquired), unspecified foot: Secondary | ICD-10-CM | POA: Diagnosis not present

## 2017-07-28 DIAGNOSIS — M25776 Osteophyte, unspecified foot: Secondary | ICD-10-CM | POA: Diagnosis not present

## 2017-08-03 ENCOUNTER — Other Ambulatory Visit: Payer: Self-pay | Admitting: Internal Medicine

## 2017-08-03 DIAGNOSIS — Z1231 Encounter for screening mammogram for malignant neoplasm of breast: Secondary | ICD-10-CM

## 2017-08-25 DIAGNOSIS — Z471 Aftercare following joint replacement surgery: Secondary | ICD-10-CM | POA: Diagnosis not present

## 2017-08-25 DIAGNOSIS — Z96653 Presence of artificial knee joint, bilateral: Secondary | ICD-10-CM | POA: Diagnosis not present

## 2017-08-25 DIAGNOSIS — M170 Bilateral primary osteoarthritis of knee: Secondary | ICD-10-CM | POA: Diagnosis not present

## 2017-08-25 DIAGNOSIS — Z96651 Presence of right artificial knee joint: Secondary | ICD-10-CM | POA: Diagnosis not present

## 2017-08-25 DIAGNOSIS — Z96659 Presence of unspecified artificial knee joint: Secondary | ICD-10-CM | POA: Diagnosis not present

## 2017-09-01 ENCOUNTER — Encounter: Payer: Self-pay | Admitting: Internal Medicine

## 2017-09-07 DIAGNOSIS — D485 Neoplasm of uncertain behavior of skin: Secondary | ICD-10-CM | POA: Diagnosis not present

## 2017-09-07 DIAGNOSIS — C44519 Basal cell carcinoma of skin of other part of trunk: Secondary | ICD-10-CM | POA: Diagnosis not present

## 2017-09-07 DIAGNOSIS — D2271 Melanocytic nevi of right lower limb, including hip: Secondary | ICD-10-CM | POA: Diagnosis not present

## 2017-09-07 DIAGNOSIS — L821 Other seborrheic keratosis: Secondary | ICD-10-CM | POA: Diagnosis not present

## 2017-09-07 DIAGNOSIS — D225 Melanocytic nevi of trunk: Secondary | ICD-10-CM | POA: Diagnosis not present

## 2017-09-07 DIAGNOSIS — L570 Actinic keratosis: Secondary | ICD-10-CM | POA: Diagnosis not present

## 2017-09-07 DIAGNOSIS — D2261 Melanocytic nevi of right upper limb, including shoulder: Secondary | ICD-10-CM | POA: Diagnosis not present

## 2017-09-07 DIAGNOSIS — D2262 Melanocytic nevi of left upper limb, including shoulder: Secondary | ICD-10-CM | POA: Diagnosis not present

## 2017-09-07 DIAGNOSIS — D2272 Melanocytic nevi of left lower limb, including hip: Secondary | ICD-10-CM | POA: Diagnosis not present

## 2017-09-07 DIAGNOSIS — D1801 Hemangioma of skin and subcutaneous tissue: Secondary | ICD-10-CM | POA: Diagnosis not present

## 2017-09-21 DIAGNOSIS — Z1231 Encounter for screening mammogram for malignant neoplasm of breast: Secondary | ICD-10-CM | POA: Diagnosis not present

## 2017-10-10 DIAGNOSIS — H8112 Benign paroxysmal vertigo, left ear: Secondary | ICD-10-CM | POA: Diagnosis not present

## 2017-10-10 DIAGNOSIS — Z6820 Body mass index (BMI) 20.0-20.9, adult: Secondary | ICD-10-CM | POA: Diagnosis not present

## 2017-10-10 DIAGNOSIS — I10 Essential (primary) hypertension: Secondary | ICD-10-CM | POA: Diagnosis not present

## 2017-10-10 DIAGNOSIS — H9192 Unspecified hearing loss, left ear: Secondary | ICD-10-CM | POA: Diagnosis not present

## 2017-10-19 DIAGNOSIS — H01001 Unspecified blepharitis right upper eyelid: Secondary | ICD-10-CM | POA: Diagnosis not present

## 2017-10-19 DIAGNOSIS — H01004 Unspecified blepharitis left upper eyelid: Secondary | ICD-10-CM | POA: Diagnosis not present

## 2017-10-19 DIAGNOSIS — H02055 Trichiasis without entropian left lower eyelid: Secondary | ICD-10-CM | POA: Diagnosis not present

## 2017-10-26 DIAGNOSIS — M79644 Pain in right finger(s): Secondary | ICD-10-CM | POA: Diagnosis not present

## 2017-10-26 DIAGNOSIS — M65331 Trigger finger, right middle finger: Secondary | ICD-10-CM | POA: Diagnosis not present

## 2017-11-03 DIAGNOSIS — H8113 Benign paroxysmal vertigo, bilateral: Secondary | ICD-10-CM | POA: Diagnosis not present

## 2017-11-03 DIAGNOSIS — H8102 Meniere's disease, left ear: Secondary | ICD-10-CM | POA: Diagnosis not present

## 2017-11-03 DIAGNOSIS — H9042 Sensorineural hearing loss, unilateral, left ear, with unrestricted hearing on the contralateral side: Secondary | ICD-10-CM | POA: Diagnosis not present

## 2017-11-03 DIAGNOSIS — H9193 Unspecified hearing loss, bilateral: Secondary | ICD-10-CM | POA: Diagnosis not present

## 2017-11-21 DIAGNOSIS — R82998 Other abnormal findings in urine: Secondary | ICD-10-CM | POA: Diagnosis not present

## 2017-11-21 DIAGNOSIS — I10 Essential (primary) hypertension: Secondary | ICD-10-CM | POA: Diagnosis not present

## 2017-11-21 DIAGNOSIS — E7849 Other hyperlipidemia: Secondary | ICD-10-CM | POA: Diagnosis not present

## 2017-11-28 DIAGNOSIS — Z1389 Encounter for screening for other disorder: Secondary | ICD-10-CM | POA: Diagnosis not present

## 2017-11-28 DIAGNOSIS — I10 Essential (primary) hypertension: Secondary | ICD-10-CM | POA: Diagnosis not present

## 2017-11-28 DIAGNOSIS — Z0000 Encounter for general adult medical examination without abnormal findings: Secondary | ICD-10-CM | POA: Diagnosis not present

## 2017-11-28 DIAGNOSIS — E7849 Other hyperlipidemia: Secondary | ICD-10-CM | POA: Diagnosis not present

## 2017-11-28 DIAGNOSIS — G4709 Other insomnia: Secondary | ICD-10-CM | POA: Diagnosis not present

## 2017-11-28 DIAGNOSIS — I129 Hypertensive chronic kidney disease with stage 1 through stage 4 chronic kidney disease, or unspecified chronic kidney disease: Secondary | ICD-10-CM | POA: Diagnosis not present

## 2017-11-28 DIAGNOSIS — R5383 Other fatigue: Secondary | ICD-10-CM | POA: Diagnosis not present

## 2017-11-28 DIAGNOSIS — Z6821 Body mass index (BMI) 21.0-21.9, adult: Secondary | ICD-10-CM | POA: Diagnosis not present

## 2017-11-28 DIAGNOSIS — E785 Hyperlipidemia, unspecified: Secondary | ICD-10-CM | POA: Diagnosis not present

## 2017-12-15 DIAGNOSIS — L565 Disseminated superficial actinic porokeratosis (DSAP): Secondary | ICD-10-CM | POA: Diagnosis not present

## 2017-12-22 DIAGNOSIS — L600 Ingrowing nail: Secondary | ICD-10-CM | POA: Diagnosis not present

## 2017-12-22 DIAGNOSIS — M71579 Other bursitis, not elsewhere classified, unspecified ankle and foot: Secondary | ICD-10-CM | POA: Diagnosis not present

## 2017-12-22 DIAGNOSIS — M2040 Other hammer toe(s) (acquired), unspecified foot: Secondary | ICD-10-CM | POA: Diagnosis not present

## 2017-12-22 DIAGNOSIS — B351 Tinea unguium: Secondary | ICD-10-CM | POA: Diagnosis not present

## 2017-12-29 DIAGNOSIS — L821 Other seborrheic keratosis: Secondary | ICD-10-CM | POA: Diagnosis not present

## 2017-12-29 DIAGNOSIS — L438 Other lichen planus: Secondary | ICD-10-CM | POA: Diagnosis not present

## 2017-12-29 DIAGNOSIS — D485 Neoplasm of uncertain behavior of skin: Secondary | ICD-10-CM | POA: Diagnosis not present

## 2018-02-01 ENCOUNTER — Other Ambulatory Visit: Payer: Self-pay | Admitting: Internal Medicine

## 2018-02-01 DIAGNOSIS — L821 Other seborrheic keratosis: Secondary | ICD-10-CM | POA: Diagnosis not present

## 2018-02-01 DIAGNOSIS — I10 Essential (primary) hypertension: Secondary | ICD-10-CM | POA: Diagnosis not present

## 2018-02-01 DIAGNOSIS — D2261 Melanocytic nevi of right upper limb, including shoulder: Secondary | ICD-10-CM | POA: Diagnosis not present

## 2018-02-01 DIAGNOSIS — R0781 Pleurodynia: Secondary | ICD-10-CM | POA: Diagnosis not present

## 2018-02-01 DIAGNOSIS — R0602 Shortness of breath: Secondary | ICD-10-CM

## 2018-02-01 DIAGNOSIS — R0600 Dyspnea, unspecified: Secondary | ICD-10-CM | POA: Diagnosis not present

## 2018-02-02 ENCOUNTER — Ambulatory Visit
Admission: RE | Admit: 2018-02-02 | Discharge: 2018-02-02 | Disposition: A | Discharge status: Home / Self Care | Payer: Medicare Other | Source: Ambulatory Visit | Attending: Internal Medicine | Admitting: Internal Medicine

## 2018-02-02 DIAGNOSIS — R0602 Shortness of breath: Secondary | ICD-10-CM | POA: Diagnosis not present

## 2018-02-02 DIAGNOSIS — R0781 Pleurodynia: Secondary | ICD-10-CM

## 2018-02-02 MED ORDER — IOPAMIDOL (ISOVUE-370) INJECTION 76%
75.0000 mL | Freq: Once | INTRAVENOUS | Status: AC | PRN
  Administered 2018-02-02: 75 mL via INTRAVENOUS

## 2018-02-20 DIAGNOSIS — S0091XA Abrasion of unspecified part of head, initial encounter: Secondary | ICD-10-CM | POA: Diagnosis not present

## 2018-02-21 DIAGNOSIS — H25813 Combined forms of age-related cataract, bilateral: Secondary | ICD-10-CM | POA: Diagnosis not present

## 2018-02-21 DIAGNOSIS — H524 Presbyopia: Secondary | ICD-10-CM | POA: Diagnosis not present

## 2018-03-14 DIAGNOSIS — R0789 Other chest pain: Secondary | ICD-10-CM | POA: Diagnosis not present

## 2018-03-14 DIAGNOSIS — R0602 Shortness of breath: Secondary | ICD-10-CM | POA: Diagnosis not present

## 2018-03-14 DIAGNOSIS — Z6822 Body mass index (BMI) 22.0-22.9, adult: Secondary | ICD-10-CM | POA: Diagnosis not present

## 2018-03-14 DIAGNOSIS — I10 Essential (primary) hypertension: Secondary | ICD-10-CM | POA: Diagnosis not present

## 2018-03-14 DIAGNOSIS — R51 Headache: Secondary | ICD-10-CM | POA: Diagnosis not present

## 2018-03-15 ENCOUNTER — Other Ambulatory Visit: Payer: Self-pay | Admitting: Internal Medicine

## 2018-03-15 DIAGNOSIS — G8929 Other chronic pain: Secondary | ICD-10-CM

## 2018-03-15 DIAGNOSIS — R51 Headache: Principal | ICD-10-CM

## 2018-03-20 ENCOUNTER — Ambulatory Visit
Admission: RE | Admit: 2018-03-20 | Discharge: 2018-03-20 | Disposition: A | Discharge status: Home / Self Care | Payer: Medicare Other | Source: Ambulatory Visit | Attending: Internal Medicine | Admitting: Internal Medicine

## 2018-03-20 DIAGNOSIS — G43709 Chronic migraine without aura, not intractable, without status migrainosus: Secondary | ICD-10-CM | POA: Diagnosis not present

## 2018-03-20 DIAGNOSIS — R51 Headache: Principal | ICD-10-CM

## 2018-03-20 DIAGNOSIS — G8929 Other chronic pain: Secondary | ICD-10-CM

## 2018-03-24 DIAGNOSIS — M65331 Trigger finger, right middle finger: Secondary | ICD-10-CM | POA: Diagnosis not present

## 2018-03-29 ENCOUNTER — Ambulatory Visit (INDEPENDENT_AMBULATORY_CARE_PROVIDER_SITE_OTHER): Payer: Medicare Other | Admitting: Cardiovascular Disease

## 2018-03-29 ENCOUNTER — Encounter: Payer: Self-pay | Admitting: Cardiovascular Disease

## 2018-03-29 VITALS — BP 132/70 | HR 62 | Ht 62.0 in | Wt 117.0 lb

## 2018-03-29 DIAGNOSIS — R0609 Other forms of dyspnea: Secondary | ICD-10-CM | POA: Diagnosis not present

## 2018-03-29 DIAGNOSIS — E7800 Pure hypercholesterolemia, unspecified: Secondary | ICD-10-CM | POA: Diagnosis not present

## 2018-03-29 DIAGNOSIS — E785 Hyperlipidemia, unspecified: Secondary | ICD-10-CM | POA: Insufficient documentation

## 2018-03-29 NOTE — Assessment & Plan Note (Signed)
Ms. Frede was referred by Dr. Sharlett Iles for relatively new onset dyspnea on exertion.  Risk factors include family history with a father who had a myocardial infarction in his 41s.  She does have hyperlipidemia.  She really denies chest pain.  She cannot exercise because of bad knees and recent total knee replacement.  I am going to get a pharmacologic Myoview stress test and 2D echocardiogram to further evaluate.

## 2018-03-29 NOTE — Progress Notes (Signed)
03/29/2018 Simeon Craft   Jul 16, 1951  [id number]  Primary Physician Leanna Battles, MD Primary Cardiologist: Lorretta Harp MD Lupe Carney, [place]  HPI:  Veronica Ho is a 67 y.o. thin appearing single Caucasian female with no children referred by Dr. Sharlett Iles for cardiovascular evaluation because of new onset dyspnea on exertion.  She works part-time as an Futures trader.  She recently moved from [place] to [place].  She has no cardiac risk factors other than family history and hyperlipidemia.  Her father had a myocardial infarction age 29.  Her LDL is in the 140 range.  She works out regularly and has noticed increasing dyspnea on exertion while at the gym.  She really denies chest pain.   No outpatient medications have been marked as taking for the 03/29/18 encounter (Office Visit) with Lorretta Harp, MD.     Allergies  Allergen Reactions  . Rivaroxaban Rash    Social History   Socioeconomic History  . Marital status: Single    Spouse name: Not on file  . Number of children: Not on file  . Years of education: Not on file  . Highest education level: Not on file  Occupational History  . Not on file  Social Needs  . Financial resource strain: Not on file  . Food insecurity:    Worry: Not on file    Inability: Not on file  . Transportation needs:    Medical: Not on file    Non-medical: Not on file  Tobacco Use  . Smoking status: Never Smoker  . Smokeless tobacco: Never Used  Substance and Sexual Activity  . Alcohol use: No  . Drug use: No  . Sexual activity: Yes  Lifestyle  . Physical activity:    Days per week: Not on file    Minutes per session: Not on file  . Stress: Not on file  Relationships  . Social connections:    Talks on phone: Not on file    Gets together: Not on file    Attends religious service: Not on file    Active member of club or organization: Not on file    Attends meetings of clubs or organizations: Not on  file    Relationship status: Not on file  . Intimate partner violence:    Fear of current or ex partner: Not on file    Emotionally abused: Not on file    Physically abused: Not on file    Forced sexual activity: Not on file  Other Topics Concern  . Not on file  Social History Narrative  . Not on file     Review of Systems: General: negative for chills, fever, night sweats or weight changes.  Cardiovascular: negative for chest pain, dyspnea on exertion, edema, orthopnea, palpitations, paroxysmal nocturnal dyspnea or shortness of breath Dermatological: negative for rash Respiratory: negative for cough or wheezing Urologic: negative for hematuria Abdominal: negative for nausea, vomiting, diarrhea, bright red blood per rectum, melena, or hematemesis Neurologic: negative for visual changes, syncope, or dizziness All other systems reviewed and are otherwise negative except as noted above.    Blood pressure 132/70, pulse 62, height 5\' 2"  (1.575 m), weight 117 lb (53.1 kg).  General appearance: alert and no distress Neck: no adenopathy, no carotid bruit, no JVD, supple, symmetrical, trachea midline and thyroid not enlarged, symmetric, no tenderness/mass/nodules Lungs: clear to auscultation bilaterally Heart: regular rate and rhythm, S1, S2 normal, no murmur, click, rub or gallop Extremities:  extremities normal, atraumatic, no cyanosis or edema Pulses: 2+ and symmetric Skin: Skin color, texture, turgor normal. No rashes or lesions Neurologic: Alert and oriented X 3, normal strength and tone. Normal symmetric reflexes. Normal coordination and gait  EKG sinus rhythm at 62 with anterolateral T wave inversion.  Personally reviewed this EKG.  ASSESSMENT AND PLAN:   Dyspnea on exertion Ms. Bachtell was referred by Dr. Sharlett Iles for relatively new onset dyspnea on exertion.  Risk factors include family history with a father who had a myocardial infarction in his 35s.  She does have  hyperlipidemia.  She really denies chest pain.  She cannot exercise because of bad knees and recent total knee replacement.  I am going to get a pharmacologic Myoview stress test and 2D echocardiogram to further evaluate.  Hyperlipidemia Recent lipid profile performed 11/21/2017 revealed total cholesterol 229, LDL 144 , and HDL 75.  She is currently not on a statin drug.      Lorretta Harp MD FACP,FACC,FAHA, [facility] 03/29/2018 2:21 PM

## 2018-03-29 NOTE — Assessment & Plan Note (Signed)
Recent lipid profile performed 11/21/2017 revealed total cholesterol 229, LDL 144 , and HDL 75.  She is currently not on a statin drug.

## 2018-03-29 NOTE — Patient Instructions (Signed)
Medication Instructions:   NO CHANGE  Testing/Procedures:  Your physician has requested that you have an echocardiogram. Echocardiography is a painless test that uses sound waves to create images of your heart. It provides your doctor with information about the size and shape of your heart and how well your heart's chambers and valves are working. This procedure takes approximately one hour. There are no restrictions for this procedure.   Your physician has requested that you have a lexiscan myoview. For further information please visit [email or web address]. Please follow instruction sheet, as given.    Follow-Up:  Your physician recommends that you schedule a follow-up appointment in: AFTER TESTING COMPLETE WITH DR Gwenlyn Found

## 2018-03-30 ENCOUNTER — Telehealth (HOSPITAL_COMMUNITY): Payer: Self-pay

## 2018-03-30 NOTE — Telephone Encounter (Signed)
Encounter complete. 

## 2018-04-04 ENCOUNTER — Ambulatory Visit (HOSPITAL_COMMUNITY)
Admission: RE | Admit: 2018-04-04 | Discharge: 2018-04-04 | Disposition: A | Discharge status: Home / Self Care | Payer: Medicare Other | Source: Ambulatory Visit | Attending: Cardiology | Admitting: Cardiology

## 2018-04-04 DIAGNOSIS — R0609 Other forms of dyspnea: Secondary | ICD-10-CM | POA: Insufficient documentation

## 2018-04-04 LAB — MYOCARDIAL PERFUSION IMAGING
CHL CUP NUCLEAR SDS: 2
CHL CUP NUCLEAR SRS: 2
CHL CUP NUCLEAR SSS: 4
CSEPPHR: 92 {beats}/min
LV dias vol: 79 mL (ref 46–106)
LV sys vol: 30 mL
NUC STRESS TID: 0.99
Rest HR: 46 {beats}/min

## 2018-04-04 MED ORDER — TECHNETIUM TC 99M TETROFOSMIN IV KIT
30.3000 | PACK | Freq: Once | INTRAVENOUS | Status: AC | PRN
  Administered 2018-04-04: 30.3 via INTRAVENOUS
  Filled 2018-04-04: qty 31

## 2018-04-04 MED ORDER — REGADENOSON 0.4 MG/5ML IV SOLN
0.4000 mg | Freq: Once | INTRAVENOUS | Status: AC
  Administered 2018-04-04: 0.4 mg via INTRAVENOUS

## 2018-04-04 MED ORDER — TECHNETIUM TC 99M TETROFOSMIN IV KIT
10.1000 | PACK | Freq: Once | INTRAVENOUS | Status: AC | PRN
  Administered 2018-04-04: 10.1 via INTRAVENOUS
  Filled 2018-04-04: qty 11

## 2018-04-04 MED ORDER — AMINOPHYLLINE 25 MG/ML IV SOLN
75.0000 mg | Freq: Once | INTRAVENOUS | Status: AC
  Administered 2018-04-04: 75 mg via INTRAVENOUS

## 2018-04-05 ENCOUNTER — Telehealth: Payer: Self-pay

## 2018-04-05 ENCOUNTER — Telehealth: Payer: Self-pay | Admitting: Cardiology

## 2018-04-05 ENCOUNTER — Other Ambulatory Visit: Payer: Self-pay

## 2018-04-05 ENCOUNTER — Ambulatory Visit (HOSPITAL_COMMUNITY): Payer: Medicare Other | Attending: Cardiology

## 2018-04-05 DIAGNOSIS — R0609 Other forms of dyspnea: Secondary | ICD-10-CM | POA: Insufficient documentation

## 2018-04-05 DIAGNOSIS — I082 Rheumatic disorders of both aortic and tricuspid valves: Secondary | ICD-10-CM | POA: Diagnosis not present

## 2018-04-05 DIAGNOSIS — E785 Hyperlipidemia, unspecified: Secondary | ICD-10-CM | POA: Insufficient documentation

## 2018-04-05 NOTE — Telephone Encounter (Signed)
Patient called requesting the results of her stress test and Echo. I informed scheduler to have give Dr Gwenlyn Found a few days to review test and allow Dr Kennon Holter nurse to give her a call back with results. Per scheduler patient is stressed about the results and wants to know if the test looked bad. Pateint voiced understanding.

## 2018-04-07 ENCOUNTER — Ambulatory Visit (INDEPENDENT_AMBULATORY_CARE_PROVIDER_SITE_OTHER): Payer: Medicare Other | Admitting: Cardiovascular Disease

## 2018-04-07 ENCOUNTER — Encounter: Payer: Self-pay | Admitting: Cardiovascular Disease

## 2018-04-07 VITALS — BP 146/86 | HR 58 | Ht 62.0 in | Wt 116.0 lb

## 2018-04-07 DIAGNOSIS — I359 Nonrheumatic aortic valve disorder, unspecified: Secondary | ICD-10-CM | POA: Diagnosis not present

## 2018-04-07 DIAGNOSIS — E7800 Pure hypercholesterolemia, unspecified: Secondary | ICD-10-CM

## 2018-04-07 DIAGNOSIS — R0609 Other forms of dyspnea: Secondary | ICD-10-CM

## 2018-04-07 NOTE — Telephone Encounter (Signed)
Ov 8/30 to discuss testing

## 2018-04-07 NOTE — Assessment & Plan Note (Signed)
Veronica Ho returns today for follow-up of her noninvasive test.  Her Myoview was low risk.  Her 2D echo showed normal LV systolic function, grade 2 diastolic dysfunction and moderate aortic insufficiency.  I am going to repeat her echo on annual basis.

## 2018-04-07 NOTE — Progress Notes (Signed)
Veronica Ho returns today for follow-up of her noninvasive test.  Her Myoview was low risk.  Her 2D echo revealed normal LV systolic function, grade 2 diastolic dysfunction and moderate aortic insufficiency.  I am going to repeat the echo on annual basis.  Lorretta Harp, M.D., [place], [facility], [name] [address] [place], [place]  [id number]  [phone] 04/07/2018 3:58 PM

## 2018-04-07 NOTE — Assessment & Plan Note (Signed)
History of hyperlipidemia with lipid profile performed 11/21/2017 revealing total cholesterol 229, LDL 144 and HDL of 75.

## 2018-04-07 NOTE — Patient Instructions (Addendum)
Medication Instructions:   NO CHANGE  Testing/Procedures:  Your physician has requested that you have an echocardiogram. Echocardiography is a painless test that uses sound waves to create images of your heart. It provides your doctor with information about the size and shape of your heart and how well your heart's chambers and valves are working. This procedure takes approximately one hour. There are no restrictions for this procedure.SCHEDULE IN ONE YEAR    Follow-Up:  Your physician wants you to follow-up in: [place] will receive a reminder letter in the mail two months in advance. If you don't receive a letter, please call our office to schedule the follow-up appointment.   If you need a refill on your cardiac medications before your next appointment, please call your pharmacy.

## 2018-04-21 NOTE — Telephone Encounter (Signed)
Close encouter

## 2018-05-08 DIAGNOSIS — I10 Essential (primary) hypertension: Secondary | ICD-10-CM | POA: Diagnosis not present

## 2018-05-08 DIAGNOSIS — R0602 Shortness of breath: Secondary | ICD-10-CM | POA: Diagnosis not present

## 2018-05-08 DIAGNOSIS — R5383 Other fatigue: Secondary | ICD-10-CM | POA: Diagnosis not present

## 2018-05-08 DIAGNOSIS — Z6822 Body mass index (BMI) 22.0-22.9, adult: Secondary | ICD-10-CM | POA: Diagnosis not present

## 2018-05-08 DIAGNOSIS — I351 Nonrheumatic aortic (valve) insufficiency: Secondary | ICD-10-CM | POA: Diagnosis not present

## 2018-05-08 DIAGNOSIS — Z23 Encounter for immunization: Secondary | ICD-10-CM | POA: Diagnosis not present

## 2018-06-06 DIAGNOSIS — M205X9 Other deformities of toe(s) (acquired), unspecified foot: Secondary | ICD-10-CM | POA: Diagnosis not present

## 2018-06-06 DIAGNOSIS — M2010 Hallux valgus (acquired), unspecified foot: Secondary | ICD-10-CM | POA: Diagnosis not present

## 2018-06-06 DIAGNOSIS — M71579 Other bursitis, not elsewhere classified, unspecified ankle and foot: Secondary | ICD-10-CM | POA: Diagnosis not present

## 2018-06-06 DIAGNOSIS — M2040 Other hammer toe(s) (acquired), unspecified foot: Secondary | ICD-10-CM | POA: Diagnosis not present

## 2018-06-19 DIAGNOSIS — H43811 Vitreous degeneration, right eye: Secondary | ICD-10-CM | POA: Diagnosis not present

## 2018-06-29 DIAGNOSIS — H04123 Dry eye syndrome of bilateral lacrimal glands: Secondary | ICD-10-CM | POA: Diagnosis not present

## 2018-06-29 DIAGNOSIS — H0015 Chalazion left lower eyelid: Secondary | ICD-10-CM | POA: Diagnosis not present

## 2018-07-17 DIAGNOSIS — L821 Other seborrheic keratosis: Secondary | ICD-10-CM | POA: Diagnosis not present

## 2018-07-17 DIAGNOSIS — Z85828 Personal history of other malignant neoplasm of skin: Secondary | ICD-10-CM | POA: Diagnosis not present

## 2018-07-17 DIAGNOSIS — L820 Inflamed seborrheic keratosis: Secondary | ICD-10-CM | POA: Diagnosis not present

## 2018-07-17 DIAGNOSIS — L570 Actinic keratosis: Secondary | ICD-10-CM | POA: Diagnosis not present

## 2018-07-17 DIAGNOSIS — D485 Neoplasm of uncertain behavior of skin: Secondary | ICD-10-CM | POA: Diagnosis not present

## 2018-08-10 DIAGNOSIS — I351 Nonrheumatic aortic (valve) insufficiency: Secondary | ICD-10-CM | POA: Diagnosis not present

## 2018-08-10 DIAGNOSIS — M5489 Other dorsalgia: Secondary | ICD-10-CM | POA: Diagnosis not present

## 2018-08-10 DIAGNOSIS — R0602 Shortness of breath: Secondary | ICD-10-CM | POA: Diagnosis not present

## 2018-08-10 DIAGNOSIS — I10 Essential (primary) hypertension: Secondary | ICD-10-CM | POA: Diagnosis not present

## 2018-08-10 DIAGNOSIS — Z6822 Body mass index (BMI) 22.0-22.9, adult: Secondary | ICD-10-CM | POA: Diagnosis not present

## 2018-08-11 ENCOUNTER — Other Ambulatory Visit: Payer: Self-pay | Admitting: Internal Medicine

## 2018-08-11 ENCOUNTER — Ambulatory Visit
Admission: RE | Admit: 2018-08-11 | Discharge: 2018-08-11 | Disposition: A | Discharge status: Home / Self Care | Payer: Medicare Other | Source: Ambulatory Visit | Attending: Internal Medicine | Admitting: Internal Medicine

## 2018-08-11 DIAGNOSIS — R079 Chest pain, unspecified: Secondary | ICD-10-CM

## 2018-08-11 DIAGNOSIS — R5383 Other fatigue: Secondary | ICD-10-CM

## 2018-08-11 DIAGNOSIS — R0602 Shortness of breath: Secondary | ICD-10-CM | POA: Diagnosis not present

## 2018-08-11 DIAGNOSIS — R0781 Pleurodynia: Secondary | ICD-10-CM

## 2018-08-11 MED ORDER — IOPAMIDOL (ISOVUE-370) INJECTION 76%
75.0000 mL | Freq: Once | INTRAVENOUS | Status: AC | PRN
  Administered 2018-08-11: 75 mL via INTRAVENOUS

## 2018-09-11 DIAGNOSIS — M71579 Other bursitis, not elsewhere classified, unspecified ankle and foot: Secondary | ICD-10-CM | POA: Diagnosis not present

## 2018-09-11 DIAGNOSIS — M2040 Other hammer toe(s) (acquired), unspecified foot: Secondary | ICD-10-CM | POA: Diagnosis not present

## 2018-09-11 DIAGNOSIS — M2010 Hallux valgus (acquired), unspecified foot: Secondary | ICD-10-CM | POA: Diagnosis not present

## 2018-09-11 DIAGNOSIS — M205X9 Other deformities of toe(s) (acquired), unspecified foot: Secondary | ICD-10-CM | POA: Diagnosis not present

## 2018-09-15 DIAGNOSIS — Z6821 Body mass index (BMI) 21.0-21.9, adult: Secondary | ICD-10-CM | POA: Diagnosis not present

## 2018-09-15 DIAGNOSIS — J302 Other seasonal allergic rhinitis: Secondary | ICD-10-CM | POA: Diagnosis not present

## 2018-09-15 DIAGNOSIS — J069 Acute upper respiratory infection, unspecified: Secondary | ICD-10-CM | POA: Diagnosis not present

## 2018-09-23 DIAGNOSIS — M79645 Pain in left finger(s): Secondary | ICD-10-CM | POA: Diagnosis not present

## 2018-10-16 DIAGNOSIS — M79674 Pain in right toe(s): Secondary | ICD-10-CM | POA: Diagnosis not present

## 2018-10-16 DIAGNOSIS — L84 Corns and callosities: Secondary | ICD-10-CM | POA: Diagnosis not present

## 2018-10-16 DIAGNOSIS — M79671 Pain in right foot: Secondary | ICD-10-CM | POA: Diagnosis not present

## 2018-12-22 DIAGNOSIS — R0602 Shortness of breath: Secondary | ICD-10-CM | POA: Diagnosis not present

## 2018-12-22 DIAGNOSIS — G4700 Insomnia, unspecified: Secondary | ICD-10-CM | POA: Diagnosis not present

## 2018-12-22 DIAGNOSIS — R110 Nausea: Secondary | ICD-10-CM | POA: Diagnosis not present

## 2018-12-22 DIAGNOSIS — Z20818 Contact with and (suspected) exposure to other bacterial communicable diseases: Secondary | ICD-10-CM | POA: Diagnosis not present

## 2018-12-22 DIAGNOSIS — F419 Anxiety disorder, unspecified: Secondary | ICD-10-CM | POA: Diagnosis not present

## 2018-12-22 DIAGNOSIS — K219 Gastro-esophageal reflux disease without esophagitis: Secondary | ICD-10-CM | POA: Diagnosis not present

## 2019-01-15 DIAGNOSIS — L821 Other seborrheic keratosis: Secondary | ICD-10-CM | POA: Diagnosis not present

## 2019-01-15 DIAGNOSIS — Z85828 Personal history of other malignant neoplasm of skin: Secondary | ICD-10-CM | POA: Diagnosis not present

## 2019-01-15 DIAGNOSIS — L820 Inflamed seborrheic keratosis: Secondary | ICD-10-CM | POA: Diagnosis not present

## 2019-01-15 DIAGNOSIS — L218 Other seborrheic dermatitis: Secondary | ICD-10-CM | POA: Diagnosis not present

## 2019-01-15 DIAGNOSIS — L570 Actinic keratosis: Secondary | ICD-10-CM | POA: Diagnosis not present

## 2019-02-06 DIAGNOSIS — I8311 Varicose veins of right lower extremity with inflammation: Secondary | ICD-10-CM | POA: Diagnosis not present

## 2019-02-06 DIAGNOSIS — I8312 Varicose veins of left lower extremity with inflammation: Secondary | ICD-10-CM | POA: Diagnosis not present

## 2019-02-06 DIAGNOSIS — I83813 Varicose veins of bilateral lower extremities with pain: Secondary | ICD-10-CM | POA: Diagnosis not present

## 2019-02-15 DIAGNOSIS — G2581 Restless legs syndrome: Secondary | ICD-10-CM | POA: Diagnosis not present

## 2019-02-15 DIAGNOSIS — I87392 Chronic venous hypertension (idiopathic) with other complications of left lower extremity: Secondary | ICD-10-CM | POA: Diagnosis not present

## 2019-02-19 DIAGNOSIS — Z85828 Personal history of other malignant neoplasm of skin: Secondary | ICD-10-CM | POA: Diagnosis not present

## 2019-02-19 DIAGNOSIS — L820 Inflamed seborrheic keratosis: Secondary | ICD-10-CM | POA: Diagnosis not present

## 2019-02-19 DIAGNOSIS — L817 Pigmented purpuric dermatosis: Secondary | ICD-10-CM | POA: Diagnosis not present

## 2019-03-02 ENCOUNTER — Encounter: Payer: Self-pay | Admitting: General Practice

## 2019-03-02 DIAGNOSIS — R82998 Other abnormal findings in urine: Secondary | ICD-10-CM | POA: Diagnosis not present

## 2019-03-02 DIAGNOSIS — E7849 Other hyperlipidemia: Secondary | ICD-10-CM | POA: Diagnosis not present

## 2019-03-09 DIAGNOSIS — E785 Hyperlipidemia, unspecified: Secondary | ICD-10-CM | POA: Diagnosis not present

## 2019-03-09 DIAGNOSIS — R0602 Shortness of breath: Secondary | ICD-10-CM | POA: Diagnosis not present

## 2019-03-09 DIAGNOSIS — I351 Nonrheumatic aortic (valve) insufficiency: Secondary | ICD-10-CM | POA: Diagnosis not present

## 2019-03-09 DIAGNOSIS — I10 Essential (primary) hypertension: Secondary | ICD-10-CM | POA: Diagnosis not present

## 2019-03-09 DIAGNOSIS — R05 Cough: Secondary | ICD-10-CM | POA: Diagnosis not present

## 2019-03-09 DIAGNOSIS — F419 Anxiety disorder, unspecified: Secondary | ICD-10-CM | POA: Diagnosis not present

## 2019-03-09 DIAGNOSIS — Z1339 Encounter for screening examination for other mental health and behavioral disorders: Secondary | ICD-10-CM | POA: Diagnosis not present

## 2019-03-09 DIAGNOSIS — Z0000 Encounter for general adult medical examination without abnormal findings: Secondary | ICD-10-CM | POA: Diagnosis not present

## 2019-03-19 DIAGNOSIS — R110 Nausea: Secondary | ICD-10-CM | POA: Diagnosis not present

## 2019-03-19 DIAGNOSIS — R5383 Other fatigue: Secondary | ICD-10-CM | POA: Diagnosis not present

## 2019-03-19 DIAGNOSIS — J029 Acute pharyngitis, unspecified: Secondary | ICD-10-CM | POA: Diagnosis not present

## 2019-05-05 DIAGNOSIS — Z23 Encounter for immunization: Secondary | ICD-10-CM | POA: Diagnosis not present

## 2019-05-22 DIAGNOSIS — M25532 Pain in left wrist: Secondary | ICD-10-CM | POA: Diagnosis not present

## 2019-05-22 DIAGNOSIS — S60211A Contusion of right wrist, initial encounter: Secondary | ICD-10-CM | POA: Diagnosis not present

## 2019-05-22 DIAGNOSIS — S60212A Contusion of left wrist, initial encounter: Secondary | ICD-10-CM | POA: Diagnosis not present

## 2019-05-23 ENCOUNTER — Telehealth: Payer: Self-pay | Admitting: *Deleted

## 2019-05-23 NOTE — Telephone Encounter (Signed)
A message was left, re: her follow up visit. 

## 2019-05-25 DIAGNOSIS — G8911 Acute pain due to trauma: Secondary | ICD-10-CM | POA: Diagnosis not present

## 2019-05-25 DIAGNOSIS — S20212A Contusion of left front wall of thorax, initial encounter: Secondary | ICD-10-CM | POA: Diagnosis not present

## 2019-05-25 DIAGNOSIS — S299XXA Unspecified injury of thorax, initial encounter: Secondary | ICD-10-CM | POA: Diagnosis not present

## 2019-06-05 DIAGNOSIS — M25532 Pain in left wrist: Secondary | ICD-10-CM | POA: Diagnosis not present

## 2019-06-05 DIAGNOSIS — M25531 Pain in right wrist: Secondary | ICD-10-CM | POA: Diagnosis not present

## 2019-06-05 DIAGNOSIS — S60212D Contusion of left wrist, subsequent encounter: Secondary | ICD-10-CM | POA: Diagnosis not present

## 2019-06-05 DIAGNOSIS — S60211D Contusion of right wrist, subsequent encounter: Secondary | ICD-10-CM | POA: Diagnosis not present

## 2019-06-13 DIAGNOSIS — M25531 Pain in right wrist: Secondary | ICD-10-CM | POA: Diagnosis not present

## 2019-06-13 DIAGNOSIS — M25532 Pain in left wrist: Secondary | ICD-10-CM | POA: Diagnosis not present

## 2019-06-14 DIAGNOSIS — S60211D Contusion of right wrist, subsequent encounter: Secondary | ICD-10-CM | POA: Diagnosis not present

## 2019-06-14 DIAGNOSIS — S60212D Contusion of left wrist, subsequent encounter: Secondary | ICD-10-CM | POA: Diagnosis not present

## 2019-08-14 DIAGNOSIS — F4321 Adjustment disorder with depressed mood: Secondary | ICD-10-CM | POA: Diagnosis not present

## 2019-08-14 DIAGNOSIS — Z1331 Encounter for screening for depression: Secondary | ICD-10-CM | POA: Diagnosis not present

## 2019-08-14 DIAGNOSIS — F419 Anxiety disorder, unspecified: Secondary | ICD-10-CM | POA: Diagnosis not present

## 2019-08-16 DIAGNOSIS — L570 Actinic keratosis: Secondary | ICD-10-CM | POA: Diagnosis not present

## 2019-08-16 DIAGNOSIS — Z85828 Personal history of other malignant neoplasm of skin: Secondary | ICD-10-CM | POA: Diagnosis not present

## 2019-08-16 DIAGNOSIS — L438 Other lichen planus: Secondary | ICD-10-CM | POA: Diagnosis not present

## 2019-08-24 DIAGNOSIS — H02055 Trichiasis without entropian left lower eyelid: Secondary | ICD-10-CM | POA: Diagnosis not present

## 2019-08-24 DIAGNOSIS — H0100A Unspecified blepharitis right eye, upper and lower eyelids: Secondary | ICD-10-CM | POA: Diagnosis not present

## 2019-08-30 DIAGNOSIS — H938X2 Other specified disorders of left ear: Secondary | ICD-10-CM | POA: Diagnosis not present

## 2019-08-30 DIAGNOSIS — R0982 Postnasal drip: Secondary | ICD-10-CM | POA: Diagnosis not present

## 2019-08-30 DIAGNOSIS — H6121 Impacted cerumen, right ear: Secondary | ICD-10-CM | POA: Diagnosis not present

## 2019-09-21 DIAGNOSIS — Z23 Encounter for immunization: Secondary | ICD-10-CM | POA: Diagnosis not present

## 2019-10-18 DIAGNOSIS — Z23 Encounter for immunization: Secondary | ICD-10-CM | POA: Diagnosis not present

## 2019-11-02 DIAGNOSIS — I129 Hypertensive chronic kidney disease with stage 1 through stage 4 chronic kidney disease, or unspecified chronic kidney disease: Secondary | ICD-10-CM | POA: Diagnosis not present

## 2019-11-02 DIAGNOSIS — I10 Essential (primary) hypertension: Secondary | ICD-10-CM | POA: Diagnosis not present

## 2019-11-28 ENCOUNTER — Telehealth: Payer: Self-pay | Admitting: Cardiovascular Disease

## 2019-11-28 DIAGNOSIS — I359 Nonrheumatic aortic valve disorder, unspecified: Secondary | ICD-10-CM

## 2019-11-28 NOTE — Telephone Encounter (Signed)
I spoke with patient and told her Dr Gwenlyn Found would like her to have echo. There are no open appointments for echo on April 30.  Patient would like to have echo prior to appointment if possible.  I scheduled patient for echo on April 28,2021 at 10:35.  Patient aware this is at the [facility] office.

## 2019-11-28 NOTE — Telephone Encounter (Signed)
I spoke with patient. Had echo in August 2019 with one year follow up echo planned. She reports for the last month she has been having shortness of breath. Will occur with exertion such as bike riding but also sometimes at rest when talking. No chest pain. Occasional dizziness. She is scheduled to see Coletta Memos, NP on April 30 and would like to have echo the same day. Previous echo order has expired. I told patient I would send message to Dr Gwenlyn Found with an update on her recent symptoms and see if he would like her to have echo.

## 2019-11-28 NOTE — Telephone Encounter (Signed)
That is fine with me, if you can arrange a 2D echo prior to ordering the same day she sees Coletta Memos

## 2019-11-28 NOTE — Telephone Encounter (Signed)
Pt c/o Shortness Of Breath: STAT if SOB developed within the last 24 hours or pt is noticeably SOB on the phone  1. Are you currently SOB (can you hear that pt is SOB on the phone)? no  2. How long have you been experiencing SOB? A while  3. Are you SOB when sitting or when up moving around? both  4. Are you currently experiencing any other symptoms? No  Patient states she has been having SOB for a while, but was not specific on how long. She has an appointment with Coletta Memos 12/07/2019 and would like to have an echo order put in so she can have it the same day as her appointment.

## 2019-11-28 NOTE — Telephone Encounter (Signed)
Left message to call office

## 2019-11-30 DIAGNOSIS — R05 Cough: Secondary | ICD-10-CM | POA: Diagnosis not present

## 2019-11-30 DIAGNOSIS — I351 Nonrheumatic aortic (valve) insufficiency: Secondary | ICD-10-CM | POA: Diagnosis not present

## 2019-11-30 DIAGNOSIS — F419 Anxiety disorder, unspecified: Secondary | ICD-10-CM | POA: Diagnosis not present

## 2019-11-30 DIAGNOSIS — R0602 Shortness of breath: Secondary | ICD-10-CM | POA: Diagnosis not present

## 2019-11-30 DIAGNOSIS — J029 Acute pharyngitis, unspecified: Secondary | ICD-10-CM | POA: Diagnosis not present

## 2019-11-30 DIAGNOSIS — K219 Gastro-esophageal reflux disease without esophagitis: Secondary | ICD-10-CM | POA: Diagnosis not present

## 2019-11-30 DIAGNOSIS — F4321 Adjustment disorder with depressed mood: Secondary | ICD-10-CM | POA: Diagnosis not present

## 2019-11-30 DIAGNOSIS — Z1152 Encounter for screening for COVID-19: Secondary | ICD-10-CM | POA: Diagnosis not present

## 2019-12-05 ENCOUNTER — Other Ambulatory Visit: Payer: Self-pay

## 2019-12-05 ENCOUNTER — Other Ambulatory Visit (HOSPITAL_COMMUNITY): Payer: Medicare Other

## 2019-12-05 ENCOUNTER — Ambulatory Visit (HOSPITAL_COMMUNITY)
Admission: RE | Admit: 2019-12-05 | Discharge: 2019-12-05 | Disposition: A | Discharge status: Home / Self Care | Payer: Medicare Other | Source: Ambulatory Visit | Attending: Cardiovascular Disease | Admitting: Cardiovascular Disease

## 2019-12-05 DIAGNOSIS — I351 Nonrheumatic aortic (valve) insufficiency: Secondary | ICD-10-CM | POA: Insufficient documentation

## 2019-12-05 DIAGNOSIS — I359 Nonrheumatic aortic valve disorder, unspecified: Secondary | ICD-10-CM

## 2019-12-05 NOTE — Progress Notes (Signed)
Cardiology Clinic Note   Patient Name: Veronica Ho Date of Encounter: 12/07/2019  Primary Care Provider:  Leanna Battles, MD Primary Cardiologist:  Quay Burow, MD  Patient Profile    Veronica Ho 69 year old female presents today for an evaluation of her shortness of breath.  Past Medical History    Past Medical History:  Diagnosis Date  . Arthritis   . Cancer ([place])    basal cell skin ca   Past Surgical History:  Procedure Laterality Date  . JOINT REPLACEMENT     Left knee 2015 in [place] [employer]   . KNEE CLOSED REDUCTION Right 05/30/2017   Procedure: CLOSED MANIPULATION RIGHT KNEE;  Surgeon: Gaynelle Arabian, MD;  Location: [facility];  Service: Orthopedics;  Laterality: Right;  . TOTAL KNEE ARTHROPLASTY Right 02/21/2017   Procedure: RIGHT TOTAL KNEE ARTHROPLASTY;  Surgeon: Gaynelle Arabian, MD;  Location: [facility];  Service: Orthopedics;  Laterality: Right;  With adductor canal block.     Allergies  Allergies  Allergen Reactions  . Rivaroxaban Rash    History of Present Illness  Veronica Ho has a PMH of dyspnea on exertion, and hyperlipidemia.  She was initially referred to cardiology 03/29/2018 for new onset of dyspnea on exertion. She worked as a Engineer, drilling. She had recently moved from [place] to [place]. Her father had an MI at age 40. She was  seen by Dr. Gwenlyn Found on 04/07/2018. She underwent a Myoview at that time which showed low risk. Her echocardiogram showed normal LV systolic function, G2 DD, moderate aortic insufficiency. She indicated that she exercise regularly and has noticed increased dyspnea while exercising at the gym. She denied chest pain.  She contacted nurse triage line on 11/28/2019 and indicated that she has been having increased shortness of breath for about 1 month. She noticed this increase shortness of breath with activities such as cycling and occasionally at rest with talking. She had an echocardiogram which  showed normal LV function, G1 DD, moderate aortic insufficiency. A plan was made for her repeat echocardiogram in 12 months.  She presents the clinic today to review her echocardiogram and further discuss her dyspnea. She states she feels well.  She has been exercising on a stationary bike every other day.  She is trying to follow a low-sodium diet and eat more fresh foods.  I reviewed her echocardiogram and talked about diastolic dysfunction.  She expressed understanding.  I will obtain her lab work from her PCP give her the salty 6, have her increase her physical activity as tolerated, and have her follow-up in 1 year.  Today she denies chest pain, shortness of breath, lower extremity edema, fatigue, palpitations, melena, hematuria, hemoptysis, diaphoresis, weakness, presyncope, syncope, orthopnea, and PND.    Home Medications    Prior to Admission medications   Medication Sig Start Date End Date Taking? Authorizing Provider  ALPRAZolam Duanne Moron) 0.5 MG tablet Take 0.5-1 tablets (0.25-0.5 mg total) by mouth at bedtime. 02/23/17   Ardeen Jourdain, PA-C    Family History    No family history on file. has no family status information on file.   Social History    Social History   Socioeconomic History  . Marital status: Single    Spouse name: Not on file  . Number of children: Not on file  . Years of education: Not on file  . Highest education level: Not on file  Occupational History  . Not on file  Tobacco Use  . Smoking status: Never  Smoker  . Smokeless tobacco: Never Used  Substance and Sexual Activity  . Alcohol use: No  . Drug use: No  . Sexual activity: Yes  Other Topics Concern  . Not on file  Social History Narrative  . Not on file   Social Determinants of Health   Financial Resource Strain:   . Difficulty of Paying Living Expenses:   Food Insecurity:   . Worried About Charity fundraiser in the Last Year:   . Arboriculturist in the Last Year:   Transportation  Needs:   . Film/video editor (Medical):   Marland Kitchen Lack of Transportation (Non-Medical):   Physical Activity:   . Days of Exercise per Week:   . Minutes of Exercise per Session:   Stress:   . Feeling of Stress :   Social Connections:   . Frequency of Communication with Friends and Family:   . Frequency of Social Gatherings with Friends and Family:   . Attends Religious Services:   . Active Member of Clubs or Organizations:   . Attends Archivist Meetings:   Marland Kitchen Marital Status:   Intimate Partner Violence:   . Fear of Current or Ex-Partner:   . Emotionally Abused:   Marland Kitchen Physically Abused:   . Sexually Abused:      Review of Systems    General:  No chills, fever, night sweats or weight changes.  Cardiovascular:  No chest pain, dyspnea on exertion, edema, orthopnea, palpitations, paroxysmal nocturnal dyspnea. Dermatological: No rash, lesions/masses Respiratory: No cough, dyspnea Urologic: No hematuria, dysuria Abdominal:   No nausea, vomiting, diarrhea, bright red blood per rectum, melena, or hematemesis Neurologic:  No visual changes, wkns, changes in mental status. All other systems reviewed and are otherwise negative except as noted above.  Physical Exam    VS:  BP 122/66   Pulse (!) 51   Temp (!) 97.2 F (36.2 C)   Ht 5\' 2"  (1.575 m)   Wt 118 lb 9.6 oz (53.8 kg)   SpO2 97%   BMI 21.69 kg/m  , BMI Body mass index is 21.69 kg/m. GEN: Well nourished, well developed, in no acute distress. HEENT: normal. Neck: Supple, no JVD, carotid bruits, or masses. Cardiac: RRR, no murmurs, rubs, or gallops. No clubbing, cyanosis, edema.  Radials/DP/PT 2+ and equal bilaterally.  Respiratory:  Respirations regular and unlabored, clear to auscultation bilaterally. GI: Soft, nontender, nondistended, BS + x 4. MS: no deformity or atrophy. Skin: warm and dry, no rash. Neuro:  Strength and sensation are intact. Psych: Normal affect.  Accessory Clinical Findings    ECG  personally reviewed by me today-sinus bradycardia ST-T wave abnormality consider anteriolateral ischemia 51 bpm  EKG 03/29/2018 87 normal sinus rhythm possible anterior infarct undetermined age ST and T wave abnormality consider inferiolateral ischemia 62 bpm  Echocardiogram 12/05/2019 IMPRESSIONS    1. Left ventricular ejection fraction, by estimation, is 60 to 65%. The  left ventricle has normal function. The left ventricle has no regional  wall motion abnormalities. Left ventricular diastolic parameters are  consistent with Grade I diastolic  dysfunction (impaired relaxation).  2. Right ventricular systolic function is normal. The right ventricular  size is normal. There is normal pulmonary artery systolic pressure.  3. Left atrial size was moderately dilated.  4. The mitral valve is grossly normal. Trivial mitral valve  regurgitation.  5. The aortic valve is tricuspid. Aortic valve regurgitation is moderate.  No aortic stenosis is present.  6. Aortic dilatation  noted. There is mild dilatation of the ascending  aorta measuring 39 mm.  7. The inferior vena cava is normal in size with greater than 50%  respiratory variability, suggesting right atrial pressure of 3 mmHg.  Assessment & Plan   1. Dyspnea on exertion-no increased work of breathing today.She contacted nurse triage line on 11/28/2019 and indicated that she has been having increased shortness of breath for about 1 month.  Echocardiogram 11/2019  showed normal LV function, G1 DD, moderate aortic insufficiency. A plan was made for her repeat echocardiogram in 12 months. Increase physical activity as tolerated Heart healthy low-sodium diet Repeat echocardiogram in 12 months  Hyperlipidemia-LDL 144 on 11/21/2017 Heart healthy low-sodium high-fiber diet Increase physical activity as tolerated Followed by PCP  Disposition: Follow-up with Dr. Gwenlyn Found in 1 year.   Jossie Ng. Bettyjo Lundblad NP-C    12/07/2019, 3:08 PM [address] Office [phone] Fax [phone]

## 2019-12-05 NOTE — Progress Notes (Signed)
  Echocardiogram 2D Echocardiogram has been performed.  Jennette Dubin 12/05/2019, 1:39 PM

## 2019-12-07 ENCOUNTER — Ambulatory Visit (INDEPENDENT_AMBULATORY_CARE_PROVIDER_SITE_OTHER): Payer: Medicare Other | Admitting: General Practice

## 2019-12-07 ENCOUNTER — Telehealth: Payer: Self-pay

## 2019-12-07 ENCOUNTER — Other Ambulatory Visit: Payer: Self-pay

## 2019-12-07 ENCOUNTER — Encounter: Payer: Self-pay | Admitting: General Practice

## 2019-12-07 ENCOUNTER — Telehealth: Payer: Self-pay | Admitting: *Deleted

## 2019-12-07 VITALS — BP 122/66 | HR 51 | Temp 97.2°F | Ht 62.0 in | Wt 118.6 lb

## 2019-12-07 DIAGNOSIS — E7800 Pure hypercholesterolemia, unspecified: Secondary | ICD-10-CM

## 2019-12-07 DIAGNOSIS — R0600 Dyspnea, unspecified: Secondary | ICD-10-CM | POA: Diagnosis not present

## 2019-12-07 DIAGNOSIS — I359 Nonrheumatic aortic valve disorder, unspecified: Secondary | ICD-10-CM

## 2019-12-07 DIAGNOSIS — R0609 Other forms of dyspnea: Secondary | ICD-10-CM

## 2019-12-07 NOTE — Telephone Encounter (Signed)
Spoke to patient echo results given.Advised to repeat in 12 months. 

## 2019-12-07 NOTE — Patient Instructions (Signed)
Medication Instructions:   Your physician recommends that you continue on your current medications as directed. Please refer to the Current Medication list given to you today.   *If you need a refill on your cardiac medications before your next appointment, please call your pharmacy*   Lab Work: [place]   If you have labs (blood work) drawn today and your tests are completely normal, you will receive your results only by: Marland Kitchen MyChart Message (if you have MyChart) OR . A paper copy in the mail If you have any lab test that is abnormal or we need to change your treatment, we will call you to review the results.   Testing/Procedures: NONE ORDERED  TODAY   Follow-Up:  The Journal of Orthopaedic and Sports Physical Therapy, 44(10), 748. [email or web address]">  How to Increase Your Level of Physical Activity Getting regular physical activity is important for your overall health and well-being. Most people do not get enough exercise. There are easy ways to increase your level of physical activity, even if you have not been very active in the past or if you are just starting out. How can increasing my physical activity affect me? Physical activity has many short-term and long-term benefits. Being active on a regular basis can improve your physical and mental health as well as provide other benefits. Physical health benefits  Helping you lose weight or maintain a healthy weight.  Strengthening your muscles and bones.  Reducing your risk of certain long-term (chronic) diseases, including heart disease, cancer, and diabetes.  Being able to move around more easily and for longer periods of time without getting tired (increased stamina).  Improving your ability to fight off illness (enhanced immunity).  Being able to sleep better.  Helping you stay healthy as you get older, including: ? Helping you stay mobile, or capable of walking and moving  around. ? Preventing accidents, such as falls. ? Increasing life expectancy. Mental health benefits  Boosting your mood and improving your self-esteem.  Lowering your chance of having mental health problems, such as depression or anxiety.  Helping you feel good about your body. Other benefits  Finding new sources of fun and enjoyment.  Meeting new people who share a common interest. What steps can I take to be more physically active? Getting started  If you have a chronic illness or have not been active for a while, check with your health care provider about how to get started. Ask your health care provider what activities are safe for you.  Start out slowly. Walking or doing some simple chair exercises is a good place to start, especially if you have not been active before or for a long time.  Set goals that you can work toward. Ask your health care provider how much exercise is best for you. In general, most adults should: ? Do moderate-intensity exercise for at least 150 minutes each week (30 minutes on most days of the week) or vigorous exercise for at least 75 minutes each week, or a combination of these.  Moderate-intensity exercise can include walking at a quick pace, biking, yoga, water aerobics, or gardening.  Vigorous exercise involves activities that take more effort, such as jogging or running, playing sports, swimming laps, or jumping rope. ? Do strength exercises on at least 2 days each week. This can include weight lifting, body weight exercises, and resistance-band exercises.  Consider using a fitness tracker, such as a mobile phone app or a device worn like a watch,  that will count the number of steps you take each day. Many people strive to reach 10,000 steps a day. Choosing activities  Try to find activities that you enjoy. You are more likely to commit to an exercise routine if it does not feel like a chore.  If you have bone or joint problems, choose low-impact  exercises, like walking or swimming.  Use these tips for being successful with an exercise plan: ? Find a workout partner for accountability. ? Join a group or class, such as an aerobics class, cycling class, or sports team. ? Make family time active. Go for a walk, bike, or swim. ? Include a variety of exercises each week. Being active in your daily routines Besides your formal exercise plans, you can find ways to do physical activity during your daily routines, such as:  Walking or biking to work or to the store.  Taking the stairs instead of the elevator.  Parking farther away from the door at work or at the store.  Planning walking meetings.  Walking around while you are on the phone.  Where to find more information  Centers for Disease Control and Prevention: WorkDashboard.es  President's Council on Fitness, Sports & Nutrition: www.fitness.gov  ChooseMyPlate: FormerBoss.no Contact a health care provider if:  You have headaches, muscle aches, or joint pain.  You feel dizzy or light-headed while exercising.  You faint.  You have chest pain while exercising. Summary  Exercise benefits your mind and body at any age, even if you are just starting out.  If you have a chronic illness or have not been active for a while, check with your health care provider before increasing your physical activity.  Choose activities that are safe and enjoyable for you. Ask your health care provider what activities are safe for you.  Start slowly. Tell your health care provider if you have problems as you start to increase your activity level. This information is not intended to replace advice given to you by your health care provider. Make sure you discuss any questions you have with your health care provider. Document Revised: 04/30/2019 Document Reviewed: 02/19/2019 Elsevier Patient Education  [place], you and your health needs are  our priority.  As part of our continuing mission to provide you with exceptional heart care, we have created designated Provider Care Teams.  These Care Teams include your primary Cardiologist (physician) and Advanced Practice Providers (APPs -  Physician Assistants and Nurse Practitioners) who all work together to provide you with the care you need, when you need it.  We recommend signing up for the patient portal called "MyChart".  Sign up information is provided on this After Visit Summary.  MyChart is used to connect with patients for Virtual Visits (Telemedicine).  Patients are able to view lab/test results, encounter notes, upcoming appointments, etc.  Non-urgent messages can be sent to your provider as well.   To learn more about what you can do with MyChart, go to [email or web address].    Your next appointment:    1 year(s)  The format for your next appointment:   In Person  Provider:   You may see Quay Burow, MD or one of the following Advanced Practice Providers on your designated Care Team:    Kerin Ransom, PA-C  [place], [place]  Coletta Memos, [place]    Other Instructions

## 2019-12-07 NOTE — Telephone Encounter (Signed)
Spoke with medical records requesting moat recent lab work

## 2019-12-18 DIAGNOSIS — L820 Inflamed seborrheic keratosis: Secondary | ICD-10-CM | POA: Diagnosis not present

## 2019-12-18 DIAGNOSIS — Z85828 Personal history of other malignant neoplasm of skin: Secondary | ICD-10-CM | POA: Diagnosis not present

## 2019-12-18 DIAGNOSIS — L570 Actinic keratosis: Secondary | ICD-10-CM | POA: Diagnosis not present

## 2019-12-21 DIAGNOSIS — M19041 Primary osteoarthritis, right hand: Secondary | ICD-10-CM | POA: Diagnosis not present

## 2020-02-25 DIAGNOSIS — M71579 Other bursitis, not elsewhere classified, unspecified ankle and foot: Secondary | ICD-10-CM | POA: Diagnosis not present

## 2020-02-25 DIAGNOSIS — M2012 Hallux valgus (acquired), left foot: Secondary | ICD-10-CM | POA: Diagnosis not present

## 2020-02-25 DIAGNOSIS — L600 Ingrowing nail: Secondary | ICD-10-CM | POA: Diagnosis not present

## 2020-02-25 DIAGNOSIS — M2011 Hallux valgus (acquired), right foot: Secondary | ICD-10-CM | POA: Diagnosis not present

## 2020-03-20 DIAGNOSIS — H04123 Dry eye syndrome of bilateral lacrimal glands: Secondary | ICD-10-CM | POA: Diagnosis not present

## 2020-03-27 DIAGNOSIS — R82998 Other abnormal findings in urine: Secondary | ICD-10-CM | POA: Diagnosis not present

## 2020-03-27 DIAGNOSIS — I10 Essential (primary) hypertension: Secondary | ICD-10-CM | POA: Diagnosis not present

## 2020-03-27 DIAGNOSIS — E785 Hyperlipidemia, unspecified: Secondary | ICD-10-CM | POA: Diagnosis not present

## 2020-04-03 DIAGNOSIS — I10 Essential (primary) hypertension: Secondary | ICD-10-CM | POA: Diagnosis not present

## 2020-04-03 DIAGNOSIS — E785 Hyperlipidemia, unspecified: Secondary | ICD-10-CM | POA: Diagnosis not present

## 2020-04-03 DIAGNOSIS — H8102 Meniere's disease, left ear: Secondary | ICD-10-CM | POA: Diagnosis not present

## 2020-04-03 DIAGNOSIS — G4700 Insomnia, unspecified: Secondary | ICD-10-CM | POA: Diagnosis not present

## 2020-04-03 DIAGNOSIS — R519 Headache, unspecified: Secondary | ICD-10-CM | POA: Diagnosis not present

## 2020-04-03 DIAGNOSIS — R05 Cough: Secondary | ICD-10-CM | POA: Diagnosis not present

## 2020-04-03 DIAGNOSIS — Z0000 Encounter for general adult medical examination without abnormal findings: Secondary | ICD-10-CM | POA: Diagnosis not present

## 2020-04-03 DIAGNOSIS — K219 Gastro-esophageal reflux disease without esophagitis: Secondary | ICD-10-CM | POA: Diagnosis not present

## 2020-04-03 DIAGNOSIS — Z1212 Encounter for screening for malignant neoplasm of rectum: Secondary | ICD-10-CM | POA: Diagnosis not present

## 2020-04-03 DIAGNOSIS — I129 Hypertensive chronic kidney disease with stage 1 through stage 4 chronic kidney disease, or unspecified chronic kidney disease: Secondary | ICD-10-CM | POA: Diagnosis not present

## 2020-04-03 DIAGNOSIS — F419 Anxiety disorder, unspecified: Secondary | ICD-10-CM | POA: Diagnosis not present

## 2020-04-03 DIAGNOSIS — I351 Nonrheumatic aortic (valve) insufficiency: Secondary | ICD-10-CM | POA: Diagnosis not present

## 2020-04-04 DIAGNOSIS — H04123 Dry eye syndrome of bilateral lacrimal glands: Secondary | ICD-10-CM | POA: Diagnosis not present

## 2020-04-04 DIAGNOSIS — H0100A Unspecified blepharitis right eye, upper and lower eyelids: Secondary | ICD-10-CM | POA: Diagnosis not present

## 2020-04-04 DIAGNOSIS — H25813 Combined forms of age-related cataract, bilateral: Secondary | ICD-10-CM | POA: Diagnosis not present

## 2020-04-15 DIAGNOSIS — L918 Other hypertrophic disorders of the skin: Secondary | ICD-10-CM | POA: Diagnosis not present

## 2020-04-15 DIAGNOSIS — L820 Inflamed seborrheic keratosis: Secondary | ICD-10-CM | POA: Diagnosis not present

## 2020-04-15 DIAGNOSIS — L570 Actinic keratosis: Secondary | ICD-10-CM | POA: Diagnosis not present

## 2020-04-15 DIAGNOSIS — Z85828 Personal history of other malignant neoplasm of skin: Secondary | ICD-10-CM | POA: Diagnosis not present

## 2020-04-15 DIAGNOSIS — L821 Other seborrheic keratosis: Secondary | ICD-10-CM | POA: Diagnosis not present

## 2020-04-15 DIAGNOSIS — D485 Neoplasm of uncertain behavior of skin: Secondary | ICD-10-CM | POA: Diagnosis not present

## 2020-04-24 DIAGNOSIS — H04123 Dry eye syndrome of bilateral lacrimal glands: Secondary | ICD-10-CM | POA: Diagnosis not present

## 2020-04-24 DIAGNOSIS — H25813 Combined forms of age-related cataract, bilateral: Secondary | ICD-10-CM | POA: Diagnosis not present

## 2020-04-24 DIAGNOSIS — H524 Presbyopia: Secondary | ICD-10-CM | POA: Diagnosis not present

## 2020-04-24 DIAGNOSIS — H0100A Unspecified blepharitis right eye, upper and lower eyelids: Secondary | ICD-10-CM | POA: Diagnosis not present

## 2020-05-02 DIAGNOSIS — Z96651 Presence of right artificial knee joint: Secondary | ICD-10-CM | POA: Diagnosis not present

## 2020-05-05 DIAGNOSIS — Z23 Encounter for immunization: Secondary | ICD-10-CM | POA: Diagnosis not present

## 2020-05-10 IMAGING — CT CT ANGIO CHEST
2 of 7 series · 13 of 36 positions shown · IV contrast (iopamidol)
Comparison: None.

CLINICAL DATA: Shortness of breath and pleuritic pain.

EXAM:
CT ANGIOGRAPHY CHEST WITH CONTRAST
TECHNIQUE: Multidetector CT imaging of the chest was performed using the
standard protocol during bolus administration of intravenous
contrast. Multiplanar CT image reconstructions and MIPs were
obtained to evaluate the vascular anatomy.
Creatinine was obtained on site at [HOSPITAL] at [REDACTED].
Results: Creatinine 0.7 mg/dL.
CONTRAST:  75mL B9PRW4-00N IOPAMIDOL (B9PRW4-00N) INJECTION 76%

[Series 6: cta pulmonary 2.00 bv36 s3 cor · coronal · 0.59mm/px · 1 of 137 slices shown]
[im 69/137  mediastinal]
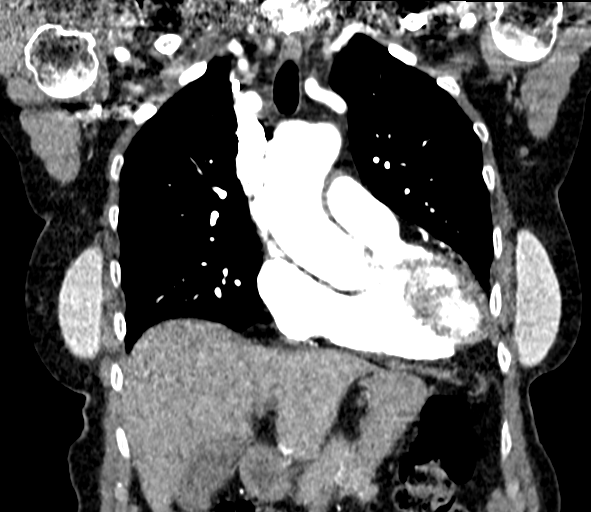

[Series 10: cta pulmonary 1.00 bv36 s3 thins · axial · 0.68mm/px · z∈[+1349,+1602]mm · 12 of 300 slices shown]
[im 24/300  lung]
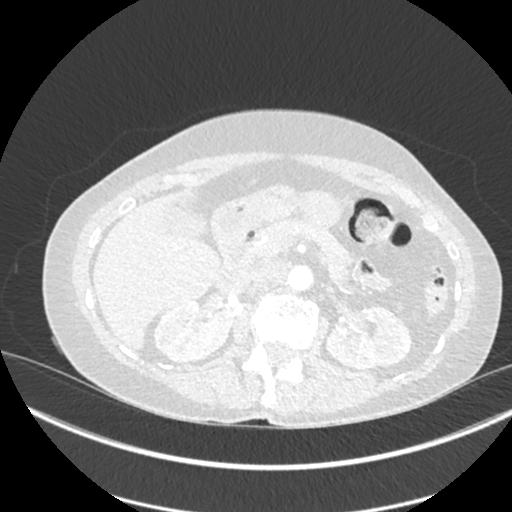
[im 47/300  mediastinal]
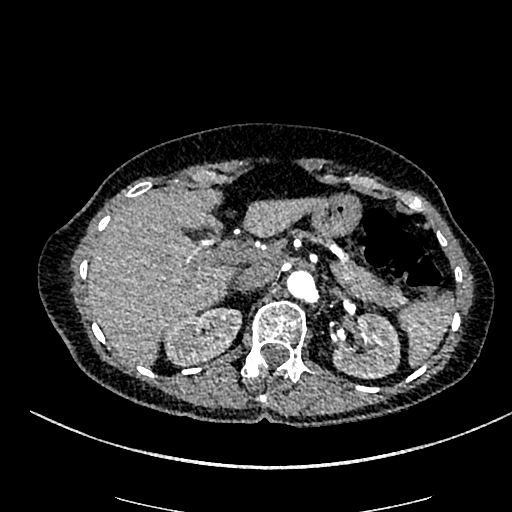
[im 70/300  lung]
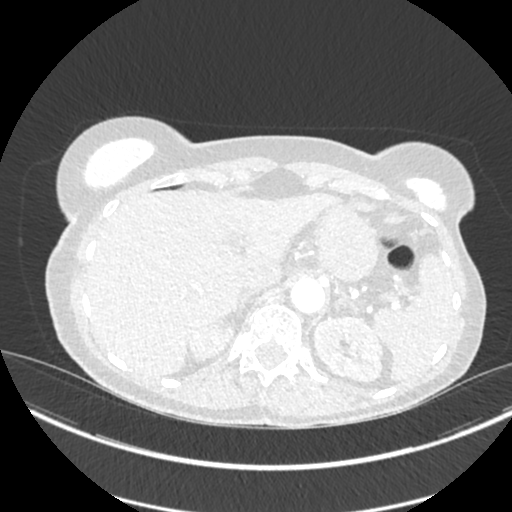
[im 93/300  mediastinal]
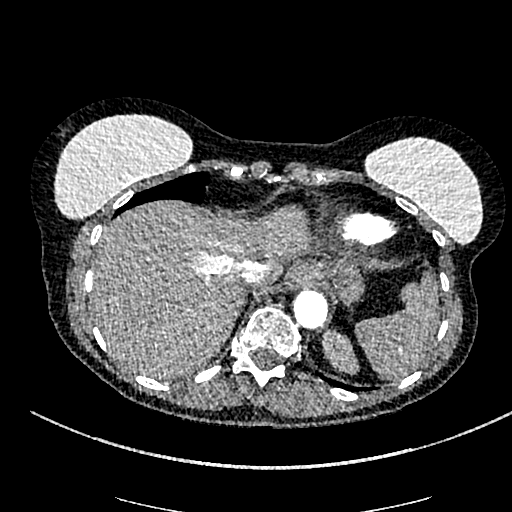
[im 116/300  lung]
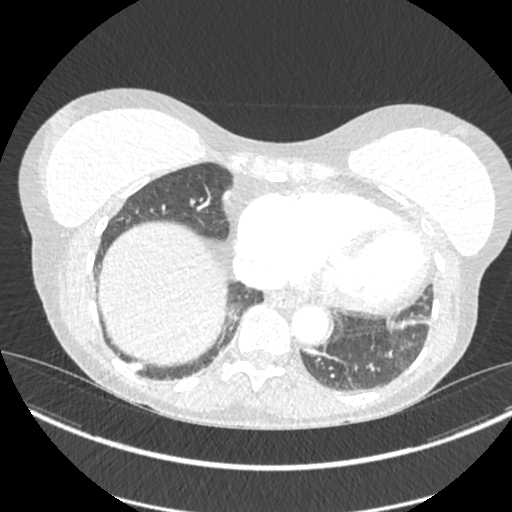
[im 139/300  mediastinal]
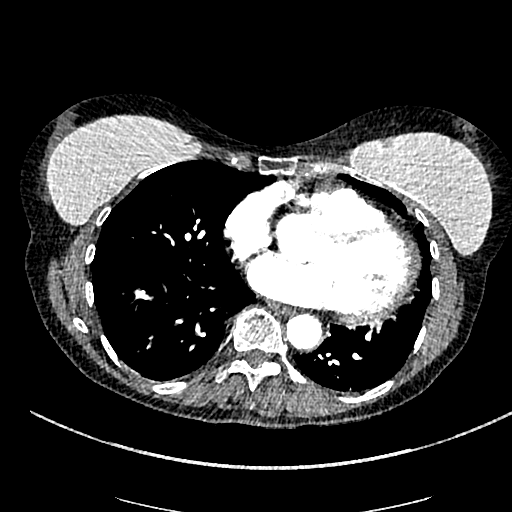
[im 162/300  lung]
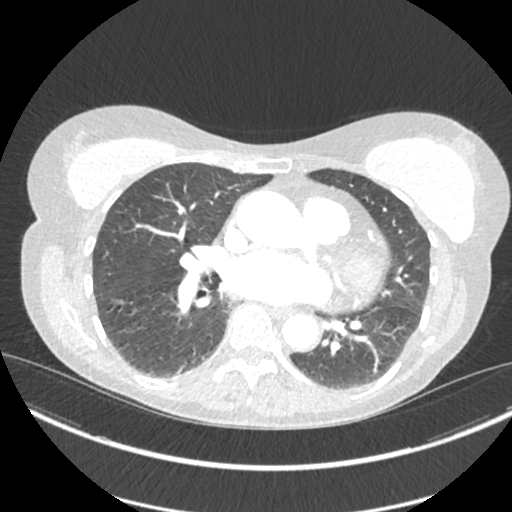
[im 185/300  mediastinal]
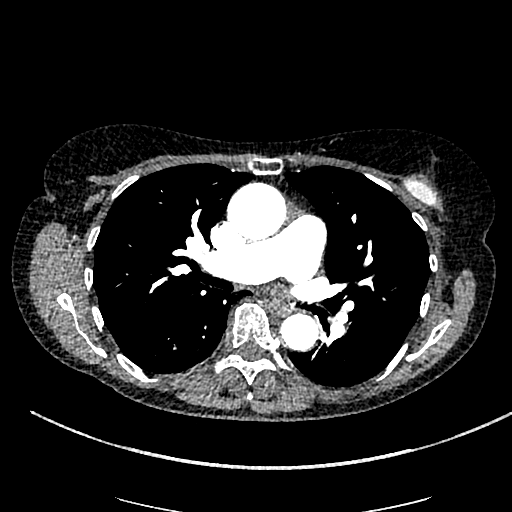
[im 208/300  lung]
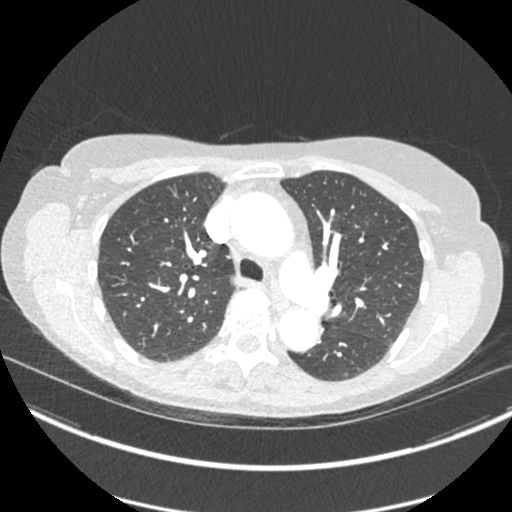
[im 231/300  mediastinal]
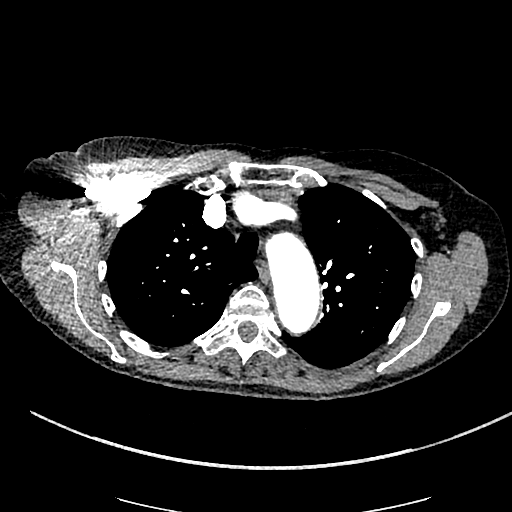
[im 254/300  lung]
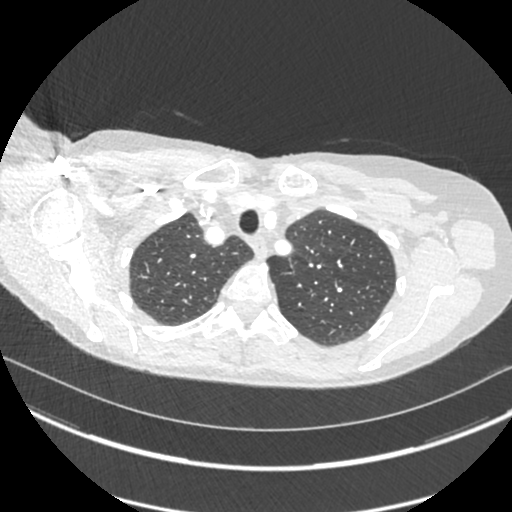
[im 277/300  mediastinal]
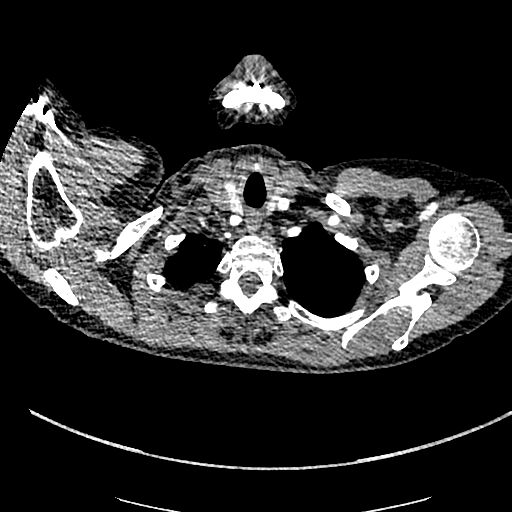

[13 of 36 positions shown; findings below may reference images not displayed]

FINDINGS: Cardiovascular: Satisfactory opacification of the pulmonary arteries
to the segmental level. No evidence of pulmonary embolism.
Borderline heart size. No pericardial effusion. Prominent but
nonaneurysmal ascending aorta measuring up to 3.6 cm in diameter.
The great vessels are tortuous.

Mediastinum/Nodes: Negative for adenopathy or mass

Lungs/Pleura: The central airways are clear. Mild atelectasis in the
lower lobes. There is no edema, consolidation, effusion, or
pneumothorax.

Upper Abdomen: Negative

Musculoskeletal: Bilateral breast implants. No acute or aggressive
finding

Review of the MIP images confirms the above findings.
IMPRESSION: 1. Negative for pulmonary embolism or other acute finding.
2. Mild atelectasis.

## 2020-06-13 DIAGNOSIS — Z1231 Encounter for screening mammogram for malignant neoplasm of breast: Secondary | ICD-10-CM | POA: Diagnosis not present

## 2020-06-30 DIAGNOSIS — Z23 Encounter for immunization: Secondary | ICD-10-CM | POA: Diagnosis not present

## 2020-07-14 DIAGNOSIS — H25813 Combined forms of age-related cataract, bilateral: Secondary | ICD-10-CM | POA: Diagnosis not present

## 2020-07-14 DIAGNOSIS — H04123 Dry eye syndrome of bilateral lacrimal glands: Secondary | ICD-10-CM | POA: Diagnosis not present

## 2020-07-14 DIAGNOSIS — H0100A Unspecified blepharitis right eye, upper and lower eyelids: Secondary | ICD-10-CM | POA: Diagnosis not present

## 2020-09-01 DIAGNOSIS — M71579 Other bursitis, not elsewhere classified, unspecified ankle and foot: Secondary | ICD-10-CM | POA: Diagnosis not present

## 2020-09-01 DIAGNOSIS — L600 Ingrowing nail: Secondary | ICD-10-CM | POA: Diagnosis not present

## 2020-09-01 DIAGNOSIS — M2011 Hallux valgus (acquired), right foot: Secondary | ICD-10-CM | POA: Diagnosis not present

## 2020-09-01 DIAGNOSIS — M2012 Hallux valgus (acquired), left foot: Secondary | ICD-10-CM | POA: Diagnosis not present

## 2020-09-03 DIAGNOSIS — H527 Unspecified disorder of refraction: Secondary | ICD-10-CM | POA: Diagnosis not present

## 2020-09-03 DIAGNOSIS — H04123 Dry eye syndrome of bilateral lacrimal glands: Secondary | ICD-10-CM | POA: Diagnosis not present

## 2020-09-03 DIAGNOSIS — H35373 Puckering of macula, bilateral: Secondary | ICD-10-CM | POA: Diagnosis not present

## 2020-09-03 DIAGNOSIS — H25813 Combined forms of age-related cataract, bilateral: Secondary | ICD-10-CM | POA: Diagnosis not present

## 2020-09-03 DIAGNOSIS — H02055 Trichiasis without entropian left lower eyelid: Secondary | ICD-10-CM | POA: Diagnosis not present

## 2020-09-03 DIAGNOSIS — H43813 Vitreous degeneration, bilateral: Secondary | ICD-10-CM | POA: Diagnosis not present

## 2020-09-09 DIAGNOSIS — D225 Melanocytic nevi of trunk: Secondary | ICD-10-CM | POA: Diagnosis not present

## 2020-09-09 DIAGNOSIS — L570 Actinic keratosis: Secondary | ICD-10-CM | POA: Diagnosis not present

## 2020-09-09 DIAGNOSIS — L209 Atopic dermatitis, unspecified: Secondary | ICD-10-CM | POA: Diagnosis not present

## 2020-09-26 DIAGNOSIS — H04123 Dry eye syndrome of bilateral lacrimal glands: Secondary | ICD-10-CM | POA: Diagnosis not present

## 2020-10-02 DIAGNOSIS — H52202 Unspecified astigmatism, left eye: Secondary | ICD-10-CM | POA: Diagnosis not present

## 2020-10-02 DIAGNOSIS — H04123 Dry eye syndrome of bilateral lacrimal glands: Secondary | ICD-10-CM | POA: Diagnosis not present

## 2020-10-02 DIAGNOSIS — Z96653 Presence of artificial knee joint, bilateral: Secondary | ICD-10-CM | POA: Diagnosis not present

## 2020-10-02 DIAGNOSIS — H02055 Trichiasis without entropian left lower eyelid: Secondary | ICD-10-CM | POA: Diagnosis not present

## 2020-10-02 DIAGNOSIS — H43813 Vitreous degeneration, bilateral: Secondary | ICD-10-CM | POA: Diagnosis not present

## 2020-10-02 DIAGNOSIS — H25811 Combined forms of age-related cataract, right eye: Secondary | ICD-10-CM | POA: Diagnosis not present

## 2020-10-02 DIAGNOSIS — H35373 Puckering of macula, bilateral: Secondary | ICD-10-CM | POA: Diagnosis not present

## 2020-10-02 DIAGNOSIS — F419 Anxiety disorder, unspecified: Secondary | ICD-10-CM | POA: Diagnosis not present

## 2020-10-02 DIAGNOSIS — H25813 Combined forms of age-related cataract, bilateral: Secondary | ICD-10-CM | POA: Diagnosis not present

## 2020-10-03 DIAGNOSIS — H25812 Combined forms of age-related cataract, left eye: Secondary | ICD-10-CM | POA: Diagnosis not present

## 2020-10-03 DIAGNOSIS — Z961 Presence of intraocular lens: Secondary | ICD-10-CM | POA: Diagnosis not present

## 2020-10-03 DIAGNOSIS — H52202 Unspecified astigmatism, left eye: Secondary | ICD-10-CM | POA: Diagnosis not present

## 2020-10-09 DIAGNOSIS — H527 Unspecified disorder of refraction: Secondary | ICD-10-CM | POA: Diagnosis not present

## 2020-10-09 DIAGNOSIS — H52202 Unspecified astigmatism, left eye: Secondary | ICD-10-CM | POA: Diagnosis not present

## 2020-10-09 DIAGNOSIS — Z833 Family history of diabetes mellitus: Secondary | ICD-10-CM | POA: Diagnosis not present

## 2020-10-09 DIAGNOSIS — F419 Anxiety disorder, unspecified: Secondary | ICD-10-CM | POA: Diagnosis not present

## 2020-10-09 DIAGNOSIS — Z79899 Other long term (current) drug therapy: Secondary | ICD-10-CM | POA: Diagnosis not present

## 2020-10-09 DIAGNOSIS — H25812 Combined forms of age-related cataract, left eye: Secondary | ICD-10-CM | POA: Diagnosis not present

## 2020-10-09 DIAGNOSIS — H43813 Vitreous degeneration, bilateral: Secondary | ICD-10-CM | POA: Diagnosis not present

## 2020-10-09 DIAGNOSIS — H25813 Combined forms of age-related cataract, bilateral: Secondary | ICD-10-CM | POA: Diagnosis not present

## 2020-10-10 DIAGNOSIS — Z961 Presence of intraocular lens: Secondary | ICD-10-CM | POA: Diagnosis not present

## 2020-10-13 DIAGNOSIS — E7800 Pure hypercholesterolemia, unspecified: Secondary | ICD-10-CM | POA: Diagnosis not present

## 2020-10-13 DIAGNOSIS — R5382 Chronic fatigue, unspecified: Secondary | ICD-10-CM | POA: Diagnosis not present

## 2020-10-13 DIAGNOSIS — F419 Anxiety disorder, unspecified: Secondary | ICD-10-CM | POA: Diagnosis not present

## 2020-10-13 DIAGNOSIS — T85848A Pain due to other internal prosthetic devices, implants and grafts, initial encounter: Secondary | ICD-10-CM | POA: Diagnosis not present

## 2020-10-27 DIAGNOSIS — H26491 Other secondary cataract, right eye: Secondary | ICD-10-CM | POA: Diagnosis not present

## 2020-10-27 DIAGNOSIS — Z961 Presence of intraocular lens: Secondary | ICD-10-CM | POA: Diagnosis not present

## 2020-11-18 DIAGNOSIS — M542 Cervicalgia: Secondary | ICD-10-CM | POA: Diagnosis not present

## 2020-12-01 DIAGNOSIS — H5040 Unspecified heterotropia: Secondary | ICD-10-CM | POA: Diagnosis not present

## 2020-12-01 DIAGNOSIS — Z961 Presence of intraocular lens: Secondary | ICD-10-CM | POA: Diagnosis not present

## 2020-12-09 ENCOUNTER — Other Ambulatory Visit (HOSPITAL_COMMUNITY): Payer: Medicare Other

## 2020-12-12 ENCOUNTER — Ambulatory Visit (HOSPITAL_COMMUNITY): Payer: Medicare Other | Attending: Internal Medicine

## 2020-12-12 ENCOUNTER — Other Ambulatory Visit: Payer: Self-pay

## 2020-12-12 DIAGNOSIS — I359 Nonrheumatic aortic valve disorder, unspecified: Secondary | ICD-10-CM | POA: Insufficient documentation

## 2020-12-12 DIAGNOSIS — R0600 Dyspnea, unspecified: Secondary | ICD-10-CM | POA: Diagnosis not present

## 2020-12-12 DIAGNOSIS — R0609 Other forms of dyspnea: Secondary | ICD-10-CM

## 2020-12-12 LAB — ECHOCARDIOGRAM COMPLETE
Area-P 1/2: 1.86 cm2
P 1/2 time: 709 msec
S' Lateral: 2.6 cm

## 2020-12-17 ENCOUNTER — Telehealth: Payer: Self-pay | Admitting: Cardiovascular Disease

## 2020-12-17 DIAGNOSIS — I359 Nonrheumatic aortic valve disorder, unspecified: Secondary | ICD-10-CM

## 2020-12-17 NOTE — Telephone Encounter (Signed)
Pt had an Echo done on 12/12/20, she would like for someone to call her with her results. Please advise

## 2020-12-17 NOTE — Telephone Encounter (Signed)
Lorretta Harp, MD  12/12/2020 1:08 PM EDT      2D echo unchanged from prior study with normal LV function, grade 1 diastolic dysfunction and moderate AI. Repeat 12 months     Patient aware and verbalized understanding.  Order placed for repeat in 22m.

## 2020-12-19 ENCOUNTER — Telehealth: Payer: Self-pay

## 2020-12-19 DIAGNOSIS — M79672 Pain in left foot: Secondary | ICD-10-CM | POA: Diagnosis not present

## 2020-12-19 NOTE — Telephone Encounter (Signed)
Patient came in wanting med records.  Was trying to attend her appt at Ortho across hall.  I contacted Caren Griffins and she was coming out to discuss records.   The patient said she could not wait as she had appt across the hall and left.  Patient came back again within 5 minutes and said she was back and to get the records.  I contacted Caren Griffins again and she was going to come out and patient once again said she had to go back across the hall to see if they called her name for her appt. And left again.  She returns again in minutes and says she only has a few minutes as they might call her for her appt.at Ortho.  I tell her to please just handle her appt at Ortho and come back once that is completed.  She returns some time later and walks in front of patients in line.  I ask her to please have a seat and someone will be w her asap. It was extremely busy and a line of people to check in...she waits briefly and gets up and gets in front of another patient and says Do I have to wait?  Just mail me my records.  I inform her that we need signatures and such for important docs.and the people in line have appts and can not be late and that I promise I will help her in just a few.  She gets angry and ask my name and stomps out.  12-19-20  VB

## 2020-12-23 DIAGNOSIS — L84 Corns and callosities: Secondary | ICD-10-CM | POA: Diagnosis not present

## 2020-12-23 DIAGNOSIS — E119 Type 2 diabetes mellitus without complications: Secondary | ICD-10-CM | POA: Diagnosis not present

## 2020-12-25 DIAGNOSIS — M79672 Pain in left foot: Secondary | ICD-10-CM | POA: Diagnosis not present

## 2020-12-25 DIAGNOSIS — M79631 Pain in right forearm: Secondary | ICD-10-CM | POA: Diagnosis not present

## 2021-01-29 DIAGNOSIS — K5730 Diverticulosis of large intestine without perforation or abscess without bleeding: Secondary | ICD-10-CM | POA: Diagnosis not present

## 2021-01-29 DIAGNOSIS — Z1211 Encounter for screening for malignant neoplasm of colon: Secondary | ICD-10-CM | POA: Diagnosis not present

## 2021-01-29 DIAGNOSIS — Z800 Family history of malignant neoplasm of digestive organs: Secondary | ICD-10-CM | POA: Diagnosis not present

## 2021-02-02 DIAGNOSIS — F419 Anxiety disorder, unspecified: Secondary | ICD-10-CM | POA: Diagnosis not present

## 2021-02-02 DIAGNOSIS — I10 Essential (primary) hypertension: Secondary | ICD-10-CM | POA: Diagnosis not present

## 2021-02-02 DIAGNOSIS — Z800 Family history of malignant neoplasm of digestive organs: Secondary | ICD-10-CM | POA: Diagnosis not present

## 2021-02-02 DIAGNOSIS — E785 Hyperlipidemia, unspecified: Secondary | ICD-10-CM | POA: Diagnosis not present

## 2021-02-25 DIAGNOSIS — I10 Essential (primary) hypertension: Secondary | ICD-10-CM | POA: Diagnosis not present

## 2021-03-04 DIAGNOSIS — L84 Corns and callosities: Secondary | ICD-10-CM | POA: Diagnosis not present

## 2021-03-04 DIAGNOSIS — M2011 Hallux valgus (acquired), right foot: Secondary | ICD-10-CM | POA: Diagnosis not present

## 2021-03-04 DIAGNOSIS — M79672 Pain in left foot: Secondary | ICD-10-CM | POA: Diagnosis not present

## 2021-03-04 DIAGNOSIS — M2012 Hallux valgus (acquired), left foot: Secondary | ICD-10-CM | POA: Diagnosis not present

## 2021-03-04 DIAGNOSIS — M79671 Pain in right foot: Secondary | ICD-10-CM | POA: Diagnosis not present

## 2021-03-05 DIAGNOSIS — I10 Essential (primary) hypertension: Secondary | ICD-10-CM | POA: Diagnosis not present

## 2021-03-17 DIAGNOSIS — R051 Acute cough: Secondary | ICD-10-CM | POA: Diagnosis not present

## 2021-03-17 DIAGNOSIS — R5383 Other fatigue: Secondary | ICD-10-CM | POA: Diagnosis not present

## 2021-03-17 DIAGNOSIS — Z20822 Contact with and (suspected) exposure to covid-19: Secondary | ICD-10-CM | POA: Diagnosis not present

## 2021-03-17 DIAGNOSIS — J029 Acute pharyngitis, unspecified: Secondary | ICD-10-CM | POA: Diagnosis not present

## 2021-03-17 DIAGNOSIS — R519 Headache, unspecified: Secondary | ICD-10-CM | POA: Diagnosis not present

## 2021-03-17 DIAGNOSIS — Z20828 Contact with and (suspected) exposure to other viral communicable diseases: Secondary | ICD-10-CM | POA: Diagnosis not present

## 2021-03-25 DIAGNOSIS — R0982 Postnasal drip: Secondary | ICD-10-CM | POA: Diagnosis not present

## 2021-03-25 DIAGNOSIS — K219 Gastro-esophageal reflux disease without esophagitis: Secondary | ICD-10-CM | POA: Diagnosis not present

## 2021-03-25 DIAGNOSIS — H6121 Impacted cerumen, right ear: Secondary | ICD-10-CM | POA: Diagnosis not present

## 2021-03-30 DIAGNOSIS — Z85828 Personal history of other malignant neoplasm of skin: Secondary | ICD-10-CM | POA: Diagnosis not present

## 2021-03-30 DIAGNOSIS — L821 Other seborrheic keratosis: Secondary | ICD-10-CM | POA: Diagnosis not present

## 2021-03-30 DIAGNOSIS — L820 Inflamed seborrheic keratosis: Secondary | ICD-10-CM | POA: Diagnosis not present

## 2021-04-03 DIAGNOSIS — E785 Hyperlipidemia, unspecified: Secondary | ICD-10-CM | POA: Diagnosis not present

## 2021-04-20 DIAGNOSIS — E785 Hyperlipidemia, unspecified: Secondary | ICD-10-CM | POA: Diagnosis not present

## 2021-04-20 DIAGNOSIS — Z0000 Encounter for general adult medical examination without abnormal findings: Secondary | ICD-10-CM | POA: Diagnosis not present

## 2021-04-20 DIAGNOSIS — I10 Essential (primary) hypertension: Secondary | ICD-10-CM | POA: Diagnosis not present

## 2021-04-20 DIAGNOSIS — G4700 Insomnia, unspecified: Secondary | ICD-10-CM | POA: Diagnosis not present

## 2021-04-20 DIAGNOSIS — I351 Nonrheumatic aortic (valve) insufficiency: Secondary | ICD-10-CM | POA: Diagnosis not present

## 2021-04-20 DIAGNOSIS — K219 Gastro-esophageal reflux disease without esophagitis: Secondary | ICD-10-CM | POA: Diagnosis not present

## 2021-04-20 DIAGNOSIS — F4321 Adjustment disorder with depressed mood: Secondary | ICD-10-CM | POA: Diagnosis not present

## 2021-04-20 DIAGNOSIS — R052 Subacute cough: Secondary | ICD-10-CM | POA: Diagnosis not present

## 2021-04-20 DIAGNOSIS — F419 Anxiety disorder, unspecified: Secondary | ICD-10-CM | POA: Diagnosis not present

## 2021-04-21 DIAGNOSIS — M79672 Pain in left foot: Secondary | ICD-10-CM | POA: Diagnosis not present

## 2021-04-24 DIAGNOSIS — Z23 Encounter for immunization: Secondary | ICD-10-CM | POA: Diagnosis not present

## 2021-05-19 DIAGNOSIS — M722 Plantar fascial fibromatosis: Secondary | ICD-10-CM | POA: Diagnosis not present

## 2021-05-19 DIAGNOSIS — M7051 Other bursitis of knee, right knee: Secondary | ICD-10-CM | POA: Diagnosis not present

## 2021-06-09 DIAGNOSIS — M65342 Trigger finger, left ring finger: Secondary | ICD-10-CM | POA: Diagnosis not present

## 2021-06-09 DIAGNOSIS — M65341 Trigger finger, right ring finger: Secondary | ICD-10-CM | POA: Diagnosis not present

## 2021-06-09 DIAGNOSIS — M79671 Pain in right foot: Secondary | ICD-10-CM | POA: Diagnosis not present

## 2021-07-15 DIAGNOSIS — I351 Nonrheumatic aortic (valve) insufficiency: Secondary | ICD-10-CM | POA: Diagnosis not present

## 2021-07-15 DIAGNOSIS — R5383 Other fatigue: Secondary | ICD-10-CM | POA: Diagnosis not present

## 2021-07-15 DIAGNOSIS — J069 Acute upper respiratory infection, unspecified: Secondary | ICD-10-CM | POA: Diagnosis not present

## 2021-07-15 DIAGNOSIS — R0981 Nasal congestion: Secondary | ICD-10-CM | POA: Diagnosis not present

## 2021-07-15 DIAGNOSIS — R051 Acute cough: Secondary | ICD-10-CM | POA: Diagnosis not present

## 2021-07-15 DIAGNOSIS — R0602 Shortness of breath: Secondary | ICD-10-CM | POA: Diagnosis not present

## 2021-07-15 DIAGNOSIS — I10 Essential (primary) hypertension: Secondary | ICD-10-CM | POA: Diagnosis not present

## 2021-07-15 DIAGNOSIS — Z1152 Encounter for screening for COVID-19: Secondary | ICD-10-CM | POA: Diagnosis not present

## 2021-07-23 DIAGNOSIS — J329 Chronic sinusitis, unspecified: Secondary | ICD-10-CM | POA: Diagnosis not present

## 2021-07-28 ENCOUNTER — Ambulatory Visit (INDEPENDENT_AMBULATORY_CARE_PROVIDER_SITE_OTHER): Payer: Medicare Other | Admitting: Pulmonary Disease

## 2021-07-28 ENCOUNTER — Telehealth: Payer: Self-pay | Admitting: Pulmonary Disease

## 2021-07-28 ENCOUNTER — Other Ambulatory Visit: Payer: Self-pay

## 2021-07-28 ENCOUNTER — Encounter: Payer: Self-pay | Admitting: Pulmonary Disease

## 2021-07-28 VITALS — BP 120/72 | HR 64 | Ht 62.0 in | Wt 126.4 lb

## 2021-07-28 DIAGNOSIS — J4530 Mild persistent asthma, uncomplicated: Secondary | ICD-10-CM

## 2021-07-28 DIAGNOSIS — R0982 Postnasal drip: Secondary | ICD-10-CM | POA: Diagnosis not present

## 2021-07-28 MED ORDER — IPRATROPIUM BROMIDE 0.03 % NA SOLN: 2.0000 | Freq: Two times a day (BID) | NASAL | 3 refills | Status: AC

## 2021-07-28 MED ORDER — FLUTICASONE FUROATE-VILANTEROL 100-25 MCG/ACT IN AEPB: 1.0000 | INHALATION_SPRAY | Freq: Every day | RESPIRATORY_TRACT | 3 refills | Status: DC

## 2021-07-28 MED ORDER — FLUTICASONE PROPIONATE 50 MCG/ACT NA SUSP: 1.0000 | Freq: Every day | NASAL | 2 refills | Status: AC

## 2021-07-28 MED ORDER — FLUTICASONE FUROATE-VILANTEROL 100-25 MCG/ACT IN AEPB: 1.0000 | INHALATION_SPRAY | Freq: Every day | RESPIRATORY_TRACT | 2 refills | Status: AC

## 2021-07-28 NOTE — Telephone Encounter (Signed)
Call made to pharmacy, requesting Breo with quantity of 60. Script updated. New script sent in.   Nothing further needed at this time.

## 2021-07-28 NOTE — Patient Instructions (Signed)
Start breo ellipta 1 puff daily - rinse mouth out after each use  Use fluticasone nasal spray, 1 spray per nostril daily  Use ipratropium nasal spray, 2 sprays per nostril twice daily  Complete antibiotic and steroid course from Dr. Redmond Baseman.  Follow up in 2 months with pulmonary function tests

## 2021-07-28 NOTE — Progress Notes (Signed)
Synopsis: Referred in December 2022 for shortness of breath by Leanna Battles, MD  Subjective:   PATIENT ID: Veronica Ho GENDER: female DOB: October 06, 1950, MRN: [id number]   HPI  Chief Complaint  Patient presents with   Consult    Referred by PCP for SOB    Veronica Ho is a 70 year old woman, never smoker who is referred to pulmonary clinic for shortness of breath and cough.   She reports having an upper respiratory infection that started a couple of weeks ago with symptoms of cough, sinus congestion with post-nasal drainage and dyspnea. She does report intermittent wheezing. She reports coughing with deep inspiration. She was evaluated by Dr. Redmond Baseman of ENT on 07/23/21 and treated for recurrent sinusitis with levquin and course of medrol dose pack. She reports improvement of her symptoms but continues to have cough and dyspnea. She reports similar symptoms in August after a cold like illness.  She denies issues with heart burn or reflux. She denies issues with allergies or season changes.   She is a never smoker. Denies history of second hand smoking.   Past Medical History:  Diagnosis Date   Arthritis    Cancer ([place])    basal cell skin ca   Insomnia      Family History  Problem Relation Age of Onset   Colon cancer Mother      Social History   Socioeconomic History   Marital status: Single    Spouse name: Not on file   Number of children: Not on file   Years of education: Not on file   Highest education level: Not on file  Occupational History   Not on file  Tobacco Use   Smoking status: Never   Smokeless tobacco: Never  Vaping Use   Vaping Use: Never used  Substance and Sexual Activity   Alcohol use: No   Drug use: No   Sexual activity: Yes  Other Topics Concern   Not on file  Social History Narrative   Not on file   Social Determinants of Health   Financial Resource Strain: Not on file  Food Insecurity: Not on file  Transportation Needs: Not on  file  Physical Activity: Not on file  Stress: Not on file  Social Connections: Not on file  Intimate Partner Violence: Not on file     Allergies  Allergen Reactions   Rivaroxaban Rash     Outpatient Medications Prior to Visit  Medication Sig Dispense Refill   ALPRAZolam (XANAX) 0.5 MG tablet Take 0.5-1 tablets (0.25-0.5 mg total) by mouth at bedtime. 20 tablet 0   Biotin 2500 MCG CAPS Take 1 capsule by mouth daily.     Calcium Citrate-Vitamin D 315-5 MG-MCG TABS Take by mouth.     escitalopram (LEXAPRO) 10 MG tablet Take 10 mg by mouth daily.     famotidine (PEPCID) 20 MG tablet 1 tablet at bedtime as needed     hydrOXYzine (VISTARIL) 25 MG capsule 1 capsule at bedtime as needed     levofloxacin (LEVAQUIN) 500 MG tablet Take 500 mg by mouth daily.     methylPREDNISolone (MEDROL DOSEPAK) 4 MG TBPK tablet See admin instructions. follow package directions     Multiple Vitamins-Minerals (MULTIVITAMIN WITH MINERALS) tablet Take 1 tablet by mouth daily.     rosuvastatin (CRESTOR) 10 MG tablet 1 tablet     sertraline (ZOLOFT) 25 MG tablet Take 25 mg by mouth 1 day or 1 dose.     ipratropium (ATROVENT)  0.03 % nasal spray SMARTSIG:2 Spray(s) Both Nares Twice Daily PRN     No facility-administered medications prior to visit.    Review of Systems  Constitutional:  Negative for chills, fever, malaise/fatigue and weight loss.  HENT:  Negative for congestion, sinus pain and sore throat.        Post-nasal drainage  Eyes: Negative.   Respiratory:  Positive for cough, shortness of breath and wheezing. Negative for hemoptysis and sputum production.   Cardiovascular:  Negative for chest pain, palpitations, orthopnea, claudication and leg swelling.  Gastrointestinal:  Negative for abdominal pain, heartburn, nausea and vomiting.  Genitourinary: Negative.   Musculoskeletal:  Negative for joint pain and myalgias.  Skin:  Negative for rash.  Neurological:  Negative for weakness.   Endo/Heme/Allergies: Negative.   Psychiatric/Behavioral: Negative.     Objective:   Vitals:   07/28/21 1338  BP: 120/72  Pulse: 64  SpO2: 97%  Weight: 126 lb 6.4 oz (57.3 kg)  Height: 5\' 2"  (1.575 m)   Physical Exam Constitutional:      General: She is not in acute distress.    Appearance: She is not ill-appearing.  HENT:     Head: Normocephalic and atraumatic.     Nose: Nose normal.     Mouth/Throat:     Mouth: Mucous membranes are moist.     Pharynx: Oropharynx is clear.  Eyes:     General: No scleral icterus.    Conjunctiva/sclera: Conjunctivae normal.     Pupils: Pupils are equal, round, and reactive to light.  Cardiovascular:     Rate and Rhythm: Normal rate and regular rhythm.     Pulses: Normal pulses.     Heart sounds: Normal heart sounds. No murmur heard. Pulmonary:     Effort: Pulmonary effort is normal.     Breath sounds: Normal breath sounds. No wheezing, rhonchi or rales.  Abdominal:     General: Bowel sounds are normal.     Palpations: Abdomen is soft.  Musculoskeletal:     Right lower leg: No edema.     Left lower leg: No edema.  Lymphadenopathy:     Cervical: No cervical adenopathy.  Skin:    General: Skin is warm and dry.  Neurological:     General: No focal deficit present.     Mental Status: She is alert.  Psychiatric:        Mood and Affect: Mood normal.        Behavior: Behavior normal.        Thought Content: Thought content normal.        Judgment: Judgment normal.     CBC    Component Value Date/Time   WBC 10.7 (H) 05/30/2017 1331   RBC 4.66 05/30/2017 1331   HGB 12.6 05/30/2017 1331   HCT 39.2 05/30/2017 1331   PLT 303 05/30/2017 1331   MCV 84.1 05/30/2017 1331   MCH 27.0 05/30/2017 1331   MCHC 32.1 05/30/2017 1331   RDW 14.5 05/30/2017 1331   BMP Latest Ref Rng & Units 02/23/2017 02/22/2017 02/16/2017  Glucose 65 - 99 mg/dL 99 120(H) 104(H)  BUN 6 - 20 mg/dL 12 13 11   Creatinine 0.44 - 1.00 mg/dL 0.66 0.69 0.67  Sodium  135 - 145 mmol/L 142 142 143  Potassium 3.5 - 5.1 mmol/L 3.6 4.6 3.8  Chloride 101 - 111 mmol/L 108 109 107  CO2 22 - 32 mmol/L 26 25 29   Calcium 8.9 - 10.3 mg/dL 8.4(L) 8.9 9.2  Chest imaging: CT Chest 08/11/2018 Mediastinum/Nodes: No enlarged mediastinal, hilar, or axillary lymph nodes. Thyroid gland, trachea, and esophagus demonstrate no significant findings.   Lungs/Pleura: No pneumothorax or pleural effusion is noted. Minimal bibasilar subsegmental atelectasis is noted.  PFT: No flowsheet data found.  Labs:  Path:  Echo 12/12/20: LV EF 19-14%, Grade I diastolic dysfunction. RV systolic function is normal. LA mildly dilated.   Heart Catheterization:  Assessment & Plan:   Mild persistent reactive airway disease without complication - Plan: Pulmonary Function Test, DISCONTINUED: fluticasone furoate-vilanterol (BREO ELLIPTA) 100-25 MCG/ACT AEPB  Post-nasal drainage - Plan: fluticasone (FLONASE) 50 MCG/ACT nasal spray, ipratropium (ATROVENT) 0.03 % nasal spray  Discussion: Veronica Ho is a 70 year old woman, never smoker who is referred to pulmonary clinic for shortness of breath and cough.   Patient's history is concerning for postviral reactive airways syndrome with ongoing cough, wheezing and shortness of breath.  She also has postnasal drainage which could be leading to cough or aggravation of reactive airways.  She is to start Breo Ellipta 1 puff daily and monitor for symptom improvement.  For the postnasal drainage she is to start fluticasone nasal spray 1 spray per nostril daily along with ipratropium nasal spray 2 sprays per nostril daily.  She can wean off these medications in the near future if she notices resolution of her symptoms.  She is to complete the steroid and antibiotic course per ENT.  Follow-up in 2 months for with pulmonary function test.   Freda Jackson, MD [facility] Office: [phone]    Current Outpatient  Medications:    ALPRAZolam (XANAX) 0.5 MG tablet, Take 0.5-1 tablets (0.25-0.5 mg total) by mouth at bedtime., Disp: 20 tablet, Rfl: 0   Biotin 2500 MCG CAPS, Take 1 capsule by mouth daily., Disp: , Rfl:    Calcium Citrate-Vitamin D 315-5 MG-MCG TABS, Take by mouth., Disp: , Rfl:    escitalopram (LEXAPRO) 10 MG tablet, Take 10 mg by mouth daily., Disp: , Rfl:    famotidine (PEPCID) 20 MG tablet, 1 tablet at bedtime as needed, Disp: , Rfl:    fluticasone (FLONASE) 50 MCG/ACT nasal spray, Place 1 spray into both nostrils daily., Disp: 16 g, Rfl: 2   hydrOXYzine (VISTARIL) 25 MG capsule, 1 capsule at bedtime as needed, Disp: , Rfl:    levofloxacin (LEVAQUIN) 500 MG tablet, Take 500 mg by mouth daily., Disp: , Rfl:    methylPREDNISolone (MEDROL DOSEPAK) 4 MG TBPK tablet, See admin instructions. follow package directions, Disp: , Rfl:    Multiple Vitamins-Minerals (MULTIVITAMIN WITH MINERALS) tablet, Take 1 tablet by mouth daily., Disp: , Rfl:    rosuvastatin (CRESTOR) 10 MG tablet, 1 tablet, Disp: , Rfl:    sertraline (ZOLOFT) 25 MG tablet, Take 25 mg by mouth 1 day or 1 dose., Disp: , Rfl:    fluticasone furoate-vilanterol (BREO ELLIPTA) 100-25 MCG/ACT AEPB, Inhale 1 puff into the lungs daily., Disp: 60 each, Rfl: 2   ipratropium (ATROVENT) 0.03 % nasal spray, Place 2 sprays into both nostrils 2 (two) times daily., Disp: 30 mL, Rfl: 3

## 2021-08-06 DIAGNOSIS — M5442 Lumbago with sciatica, left side: Secondary | ICD-10-CM | POA: Diagnosis not present

## 2021-08-06 DIAGNOSIS — Z1152 Encounter for screening for COVID-19: Secondary | ICD-10-CM | POA: Diagnosis not present

## 2021-08-06 DIAGNOSIS — R112 Nausea with vomiting, unspecified: Secondary | ICD-10-CM | POA: Diagnosis not present

## 2021-08-06 DIAGNOSIS — R519 Headache, unspecified: Secondary | ICD-10-CM | POA: Diagnosis not present

## 2021-08-06 DIAGNOSIS — R051 Acute cough: Secondary | ICD-10-CM | POA: Diagnosis not present

## 2021-08-06 DIAGNOSIS — R5383 Other fatigue: Secondary | ICD-10-CM | POA: Diagnosis not present

## 2021-08-07 DIAGNOSIS — R918 Other nonspecific abnormal finding of lung field: Secondary | ICD-10-CM | POA: Diagnosis not present

## 2021-08-07 DIAGNOSIS — J439 Emphysema, unspecified: Secondary | ICD-10-CM | POA: Diagnosis not present

## 2021-08-07 DIAGNOSIS — H16212 Exposure keratoconjunctivitis, left eye: Secondary | ICD-10-CM | POA: Diagnosis not present

## 2021-08-07 DIAGNOSIS — R079 Chest pain, unspecified: Secondary | ICD-10-CM | POA: Diagnosis not present

## 2021-08-07 DIAGNOSIS — Z79899 Other long term (current) drug therapy: Secondary | ICD-10-CM | POA: Diagnosis not present

## 2021-08-07 DIAGNOSIS — F419 Anxiety disorder, unspecified: Secondary | ICD-10-CM | POA: Diagnosis not present

## 2021-08-07 DIAGNOSIS — R0789 Other chest pain: Secondary | ICD-10-CM | POA: Diagnosis not present

## 2021-08-07 DIAGNOSIS — H5040 Unspecified heterotropia: Secondary | ICD-10-CM | POA: Diagnosis not present

## 2021-08-13 ENCOUNTER — Other Ambulatory Visit: Payer: Self-pay

## 2021-08-13 ENCOUNTER — Ambulatory Visit (INDEPENDENT_AMBULATORY_CARE_PROVIDER_SITE_OTHER): Payer: Medicare Other | Admitting: Cardiovascular Disease

## 2021-08-13 ENCOUNTER — Encounter: Payer: Self-pay | Admitting: Cardiovascular Disease

## 2021-08-13 VITALS — BP 130/72 | HR 50 | Ht 62.0 in | Wt 127.6 lb

## 2021-08-13 DIAGNOSIS — E7800 Pure hypercholesterolemia, unspecified: Secondary | ICD-10-CM

## 2021-08-13 DIAGNOSIS — Z1231 Encounter for screening mammogram for malignant neoplasm of breast: Secondary | ICD-10-CM | POA: Diagnosis not present

## 2021-08-13 DIAGNOSIS — E782 Mixed hyperlipidemia: Secondary | ICD-10-CM | POA: Diagnosis not present

## 2021-08-13 DIAGNOSIS — R0609 Other forms of dyspnea: Secondary | ICD-10-CM | POA: Diagnosis not present

## 2021-08-13 NOTE — Progress Notes (Signed)
08/13/2021 Simeon Craft   [birth year]  [id number]  Primary Physician Donnajean Lopes, MD Primary Cardiologist: Lorretta Harp MD Lupe Carney, [place]  HPI:  Veronica Ho is a 71 y.o.  thin appearing single Caucasian female with no children referred by Dr. Sharlett Iles for cardiovascular evaluation because of new onset dyspnea on exertion.  I last saw her in the office 04/07/2018.  She works part-time as an Futures trader.  She recently moved from [place] to [place].  She has no cardiac risk factors other than family history and hyperlipidemia.  Her father had a myocardial infarction age 72.  Her LDL is in the 140 range.  She works out regularly and has noticed increasing dyspnea on exertion while at the gym.    She saw Coletta Memos, FNP in the office in April of last year for similar symptoms.  A follow-up 2D echo showed normal LV systolic function, grade 1 diastolic dysfunction and moderate AI.  She was seen in the ER in [place] at the end of last month with shortness of breath and pleuritic chest pain.  A chest CTA was unremarkable troponins were negative.  She has a chronically slow heart rate with sinus bradycardia in the 50s and lateral T wave inversion which has not changed.  She says that she works out in [employer] several times a week doing recumbent bike and during this time she denies shortness of breath.  Current Meds  Medication Sig   ALPRAZolam (XANAX) 0.5 MG tablet Take 0.5-1 tablets (0.25-0.5 mg total) by mouth at bedtime.   Biotin 2500 MCG CAPS Take 1 capsule by mouth daily.   escitalopram (LEXAPRO) 10 MG tablet Take 10 mg by mouth daily.   hydrOXYzine (VISTARIL) 25 MG capsule 1 capsule at bedtime as needed   levofloxacin (LEVAQUIN) 500 MG tablet Take 500 mg by mouth daily.   Multiple Vitamins-Minerals (MULTIVITAMIN WITH MINERALS) tablet Take 1 tablet by mouth daily.   rosuvastatin (CRESTOR) 10 MG tablet 1 tablet     Allergies  Allergen Reactions    Propranolol     Other reaction(s): Fluid retention   Rivaroxaban Rash    Social History   Socioeconomic History   Marital status: Single    Spouse name: Not on file   Number of children: Not on file   Years of education: Not on file   Highest education level: Not on file  Occupational History   Not on file  Tobacco Use   Smoking status: Never   Smokeless tobacco: Never  Vaping Use   Vaping Use: Never used  Substance and Sexual Activity   Alcohol use: No   Drug use: No   Sexual activity: Yes  Other Topics Concern   Not on file  Social History Narrative   Not on file   Social Determinants of Health   Financial Resource Strain: Not on file  Food Insecurity: Not on file  Transportation Needs: Not on file  Physical Activity: Not on file  Stress: Not on file  Social Connections: Not on file  Intimate Partner Violence: Not on file     Review of Systems: General: negative for chills, fever, night sweats or weight changes.  Cardiovascular: negative for chest pain, dyspnea on exertion, edema, orthopnea, palpitations, paroxysmal nocturnal dyspnea or shortness of breath Dermatological: negative for rash Respiratory: negative for cough or wheezing Urologic: negative for hematuria Abdominal: negative for nausea, vomiting, diarrhea, bright red blood per rectum, melena, or hematemesis Neurologic:  negative for visual changes, syncope, or dizziness All other systems reviewed and are otherwise negative except as noted above.    Blood pressure 130/72, pulse (!) 50, height 5\' 2"  (1.575 m), weight 127 lb 9.6 oz (57.9 kg), SpO2 97 %.  General appearance: alert and no distress Neck: no adenopathy, no carotid bruit, no JVD, supple, symmetrical, trachea midline, and thyroid not enlarged, symmetric, no tenderness/mass/nodules Lungs: clear to auscultation bilaterally Heart: regular rate and rhythm, S1, S2 normal, no murmur, click, rub or gallop Extremities: extremities normal,  atraumatic, no cyanosis or edema Pulses: 2+ and symmetric Skin: Skin color, texture, turgor normal. No rashes or lesions Neurologic: Grossly normal  EKG sinus bradycardia 50 with inferolateral T wave inversion unchanged from prior EKGs.  I personally reviewed this EKG.  ASSESSMENT AND PLAN:   Hyperlipidemia History of hyperlipidemia on low-dose statin therapy with lipid profile performed 04/03/2021 revealing total cholesterol of 181, LDL of 93 and HDL of 76.  Dyspnea on exertion Long history of dyspnea on exertion dating back several years when I initially saw her in 2019.  A Myoview stress test at that time was nonischemic and a 2D echo revealed normal LV systolic function, grade 2 diastolic dysfunction and moderate AI.  She is had multiple CTAs along the way ruling out pulmonary embolism all of which would been unremarkable.  She was recently seen in the ER in [place].  She had pleuritic chest pain and shortness of breath.  A chest CTA was negative for PE and there were no infiltrates.  Troponins were negative.  She does have chronic lateral T wave inversion and sinus bradycardia that are unchanged.  At this point, I do not feel that her shortness of breath is cardiovascular in nature.  We will continue to follow her 2D echocardiograms for her moderate aortic insufficiency.  I am going to get a coronary calcium score.     Lorretta Harp MD FACP,FACC,FAHA, [facility] 08/13/2021 9:44 AM

## 2021-08-13 NOTE — Assessment & Plan Note (Signed)
Long history of dyspnea on exertion dating back several years when I initially saw her in 2019.  A Myoview stress test at that time was nonischemic and a 2D echo revealed normal LV systolic function, grade 2 diastolic dysfunction and moderate AI.  She is had multiple CTAs along the way ruling out pulmonary embolism all of which would been unremarkable.  She was recently seen in the ER in [place].  She had pleuritic chest pain and shortness of breath.  A chest CTA was negative for PE and there were no infiltrates.  Troponins were negative.  She does have chronic lateral T wave inversion and sinus bradycardia that are unchanged.  At this point, I do not feel that her shortness of breath is cardiovascular in nature.  We will continue to follow her 2D echocardiograms for her moderate aortic insufficiency.  I am going to get a coronary calcium score.

## 2021-08-13 NOTE — Assessment & Plan Note (Signed)
History of hyperlipidemia on low-dose statin therapy with lipid profile performed 04/03/2021 revealing total cholesterol of 181, LDL of 93 and HDL of 76.

## 2021-08-13 NOTE — Patient Instructions (Signed)
Medication Instructions:  Your physician recommends that you continue on your current medications as directed. Please refer to the Current Medication list given to you today.  *If you need a refill on your cardiac medications before your next appointment, please call your pharmacy*   Testing/Procedures: Dr. Gwenlyn Found has ordered a CT coronary calcium score.   [facility] location:  [facility] ([address], [place] [address])   This is $99 out of pocket.   Coronary CalciumScan A coronary calcium scan is an imaging test used to look for deposits of calcium and other fatty materials (plaques) in the inner lining of the blood vessels of the heart (coronary arteries). These deposits of calcium and plaques can partly clog and narrow the coronary arteries without producing any symptoms or warning signs. This puts a person at risk for a heart attack. This test can detect these deposits before symptoms develop. Tell a health care provider about: Any allergies you have. All medicines you are taking, including vitamins, herbs, eye drops, creams, and over-the-counter medicines. Any problems you or family members have had with anesthetic medicines. Any blood disorders you have. Any surgeries you have had. Any medical conditions you have. Whether you are pregnant or may be pregnant. What are the risks? Generally, this is a safe procedure. However, problems may occur, including: Harm to a pregnant woman and her unborn baby. This test involves the use of radiation. Radiation exposure can be dangerous to a pregnant woman and her unborn baby. If you are pregnant, you generally should not have this procedure done. Slight increase in the risk of cancer. This is because of the radiation involved in the test. What happens before the procedure? No preparation is needed for this procedure. What happens during the procedure? You will undress and remove any jewelry around your neck or chest. You  will put on a hospital gown. Sticky electrodes will be placed on your chest. The electrodes will be connected to an electrocardiogram (ECG) machine to record a tracing of the electrical activity of your heart. A CT scanner will take pictures of your heart. During this time, you will be asked to lie still and hold your breath for 2-3 seconds while a picture of your heart is being taken. The procedure may vary among health care providers and hospitals. What happens after the procedure? You can get dressed. You can return to your normal activities. It is up to you to get the results of your test. Ask your health care provider, or the department that is doing the test, when your results will be ready. Summary A coronary calcium scan is an imaging test used to look for deposits of calcium and other fatty materials (plaques) in the inner lining of the blood vessels of the heart (coronary arteries). Generally, this is a safe procedure. Tell your health care provider if you are pregnant or may be pregnant. No preparation is needed for this procedure. A CT scanner will take pictures of your heart. You can return to your normal activities after the scan is done. This information is not intended to replace advice given to you by your health care provider. Make sure you discuss any questions you have with your health care provider. Document Released: 01/22/2008 Document Revised: 06/14/2016 Document Reviewed: 06/14/2016 Elsevier Interactive Patient Education  2017 [place]: At [facility], you and your health needs are our priority.  As part of our continuing mission to provide you with exceptional heart  care, we have created designated Provider Care Teams.  These Care Teams include your primary Cardiologist (physician) and Advanced Practice Providers (APPs -  Physician Assistants and Nurse Practitioners) who all work together to provide you with the care you need, when you need  it.  We recommend signing up for the patient portal called "MyChart".  Sign up information is provided on this After Visit Summary.  MyChart is used to connect with patients for Virtual Visits (Telemedicine).  Patients are able to view lab/test results, encounter notes, upcoming appointments, etc.  Non-urgent messages can be sent to your provider as well.   To learn more about what you can do with MyChart, go to [email or web address].    Your next appointment:   6 month(s)  The format for your next appointment:   In Person  Provider:   Coletta Memos, FNP       Then, Quay Burow, MD will plan to see you again in 12 month(s).{

## 2021-10-02 DIAGNOSIS — L84 Corns and callosities: Secondary | ICD-10-CM | POA: Diagnosis not present

## 2021-10-02 DIAGNOSIS — M722 Plantar fascial fibromatosis: Secondary | ICD-10-CM | POA: Diagnosis not present

## 2021-10-02 DIAGNOSIS — M79672 Pain in left foot: Secondary | ICD-10-CM | POA: Diagnosis not present

## 2021-11-12 DIAGNOSIS — H0011 Chalazion right upper eyelid: Secondary | ICD-10-CM | POA: Diagnosis not present

## 2021-11-12 DIAGNOSIS — H04123 Dry eye syndrome of bilateral lacrimal glands: Secondary | ICD-10-CM | POA: Diagnosis not present

## 2021-12-03 DIAGNOSIS — Z961 Presence of intraocular lens: Secondary | ICD-10-CM | POA: Diagnosis not present

## 2021-12-03 DIAGNOSIS — H04123 Dry eye syndrome of bilateral lacrimal glands: Secondary | ICD-10-CM | POA: Diagnosis not present

## 2021-12-03 DIAGNOSIS — H26493 Other secondary cataract, bilateral: Secondary | ICD-10-CM | POA: Diagnosis not present

## 2021-12-08 ENCOUNTER — Telehealth: Payer: Self-pay | Admitting: Cardiovascular Disease

## 2021-12-08 DIAGNOSIS — R0609 Other forms of dyspnea: Secondary | ICD-10-CM

## 2021-12-08 DIAGNOSIS — I359 Nonrheumatic aortic valve disorder, unspecified: Secondary | ICD-10-CM

## 2021-12-08 NOTE — Telephone Encounter (Signed)
Spoke to patient echo rescheduled to 5/19 at 3:00 pm. ?

## 2021-12-08 NOTE — Telephone Encounter (Signed)
Pt called stating that she needs a later appt date for ECHO to be done due to her having an eye procedure. Echo expires on 12/17/21, can she get a later expiration date. Please advise ?

## 2021-12-16 ENCOUNTER — Other Ambulatory Visit (HOSPITAL_COMMUNITY): Payer: Medicare Other

## 2021-12-17 ENCOUNTER — Other Ambulatory Visit (HOSPITAL_COMMUNITY): Payer: Medicare Other

## 2021-12-17 DIAGNOSIS — H26492 Other secondary cataract, left eye: Secondary | ICD-10-CM | POA: Diagnosis not present

## 2021-12-22 DIAGNOSIS — Z780 Asymptomatic menopausal state: Secondary | ICD-10-CM | POA: Diagnosis not present

## 2021-12-25 ENCOUNTER — Ambulatory Visit (HOSPITAL_COMMUNITY): Payer: Medicare Other | Attending: Cardiology

## 2021-12-25 DIAGNOSIS — R0609 Other forms of dyspnea: Secondary | ICD-10-CM

## 2021-12-25 DIAGNOSIS — I359 Nonrheumatic aortic valve disorder, unspecified: Secondary | ICD-10-CM

## 2021-12-25 LAB — ECHOCARDIOGRAM COMPLETE
Area-P 1/2: 3.77 cm2
P 1/2 time: 727 msec
S' Lateral: 2.5 cm

## 2021-12-30 ENCOUNTER — Telehealth: Payer: Self-pay | Admitting: Cardiovascular Disease

## 2021-12-30 NOTE — Telephone Encounter (Signed)
Patient is calling to receive her results for her echocardiogram. Please call back

## 2021-12-30 NOTE — Telephone Encounter (Signed)
Patient made aware of results and verbalized understanding.    Lorretta Harp, MD  12/26/2021  6:56 AM EDT     Essentially normal study. Repeat when clinically indicated.

## 2022-01-07 DIAGNOSIS — Z01419 Encounter for gynecological examination (general) (routine) without abnormal findings: Secondary | ICD-10-CM | POA: Diagnosis not present

## 2022-01-21 DIAGNOSIS — L851 Acquired keratosis [keratoderma] palmaris et plantaris: Secondary | ICD-10-CM | POA: Diagnosis not present

## 2022-01-21 DIAGNOSIS — M2012 Hallux valgus (acquired), left foot: Secondary | ICD-10-CM | POA: Diagnosis not present

## 2022-01-21 DIAGNOSIS — M25572 Pain in left ankle and joints of left foot: Secondary | ICD-10-CM | POA: Diagnosis not present

## 2022-01-21 DIAGNOSIS — L853 Xerosis cutis: Secondary | ICD-10-CM | POA: Diagnosis not present

## 2022-01-21 DIAGNOSIS — M2011 Hallux valgus (acquired), right foot: Secondary | ICD-10-CM | POA: Diagnosis not present

## 2022-01-21 DIAGNOSIS — M25375 Other instability, left foot: Secondary | ICD-10-CM | POA: Diagnosis not present

## 2022-01-21 DIAGNOSIS — Z85828 Personal history of other malignant neoplasm of skin: Secondary | ICD-10-CM | POA: Diagnosis not present

## 2022-01-21 DIAGNOSIS — D485 Neoplasm of uncertain behavior of skin: Secondary | ICD-10-CM | POA: Diagnosis not present

## 2022-01-21 DIAGNOSIS — M216X2 Other acquired deformities of left foot: Secondary | ICD-10-CM | POA: Diagnosis not present

## 2022-01-21 DIAGNOSIS — L820 Inflamed seborrheic keratosis: Secondary | ICD-10-CM | POA: Diagnosis not present

## 2022-01-21 DIAGNOSIS — M722 Plantar fascial fibromatosis: Secondary | ICD-10-CM | POA: Diagnosis not present

## 2022-02-19 DIAGNOSIS — M722 Plantar fascial fibromatosis: Secondary | ICD-10-CM | POA: Diagnosis not present

## 2022-02-19 DIAGNOSIS — M2012 Hallux valgus (acquired), left foot: Secondary | ICD-10-CM | POA: Diagnosis not present

## 2022-02-19 DIAGNOSIS — M25572 Pain in left ankle and joints of left foot: Secondary | ICD-10-CM | POA: Diagnosis not present

## 2022-02-19 DIAGNOSIS — M216X2 Other acquired deformities of left foot: Secondary | ICD-10-CM | POA: Diagnosis not present

## 2022-02-19 DIAGNOSIS — L851 Acquired keratosis [keratoderma] palmaris et plantaris: Secondary | ICD-10-CM | POA: Diagnosis not present

## 2022-02-19 DIAGNOSIS — M898X7 Other specified disorders of bone, ankle and foot: Secondary | ICD-10-CM | POA: Diagnosis not present

## 2022-02-19 DIAGNOSIS — M2011 Hallux valgus (acquired), right foot: Secondary | ICD-10-CM | POA: Diagnosis not present

## 2022-03-01 DIAGNOSIS — H90A22 Sensorineural hearing loss, unilateral, left ear, with restricted hearing on the contralateral side: Secondary | ICD-10-CM | POA: Diagnosis not present

## 2022-03-01 DIAGNOSIS — H6122 Impacted cerumen, left ear: Secondary | ICD-10-CM | POA: Diagnosis not present

## 2022-03-03 DIAGNOSIS — I10 Essential (primary) hypertension: Secondary | ICD-10-CM | POA: Diagnosis not present

## 2022-03-03 DIAGNOSIS — S0083XA Contusion of other part of head, initial encounter: Secondary | ICD-10-CM | POA: Diagnosis not present

## 2022-03-16 DIAGNOSIS — D225 Melanocytic nevi of trunk: Secondary | ICD-10-CM | POA: Diagnosis not present

## 2022-03-16 DIAGNOSIS — Z85828 Personal history of other malignant neoplasm of skin: Secondary | ICD-10-CM | POA: Diagnosis not present

## 2022-03-16 DIAGNOSIS — L820 Inflamed seborrheic keratosis: Secondary | ICD-10-CM | POA: Diagnosis not present

## 2022-03-16 DIAGNOSIS — L821 Other seborrheic keratosis: Secondary | ICD-10-CM | POA: Diagnosis not present

## 2022-03-29 DIAGNOSIS — H903 Sensorineural hearing loss, bilateral: Secondary | ICD-10-CM | POA: Diagnosis not present

## 2022-03-30 DIAGNOSIS — M25572 Pain in left ankle and joints of left foot: Secondary | ICD-10-CM | POA: Diagnosis not present

## 2022-03-30 DIAGNOSIS — M25375 Other instability, left foot: Secondary | ICD-10-CM | POA: Diagnosis not present

## 2022-03-30 DIAGNOSIS — M722 Plantar fascial fibromatosis: Secondary | ICD-10-CM | POA: Diagnosis not present

## 2022-04-19 DIAGNOSIS — M542 Cervicalgia: Secondary | ICD-10-CM | POA: Diagnosis not present

## 2022-04-19 DIAGNOSIS — M25512 Pain in left shoulder: Secondary | ICD-10-CM | POA: Diagnosis not present

## 2022-04-26 DIAGNOSIS — F419 Anxiety disorder, unspecified: Secondary | ICD-10-CM | POA: Diagnosis not present

## 2022-04-26 DIAGNOSIS — R5382 Chronic fatigue, unspecified: Secondary | ICD-10-CM | POA: Diagnosis not present

## 2022-04-26 DIAGNOSIS — R8281 Pyuria: Secondary | ICD-10-CM | POA: Diagnosis not present

## 2022-04-26 DIAGNOSIS — R7989 Other specified abnormal findings of blood chemistry: Secondary | ICD-10-CM | POA: Diagnosis not present

## 2022-04-26 DIAGNOSIS — I10 Essential (primary) hypertension: Secondary | ICD-10-CM | POA: Diagnosis not present

## 2022-04-26 DIAGNOSIS — R82998 Other abnormal findings in urine: Secondary | ICD-10-CM | POA: Diagnosis not present

## 2022-04-26 DIAGNOSIS — Z0000 Encounter for general adult medical examination without abnormal findings: Secondary | ICD-10-CM | POA: Diagnosis not present

## 2022-04-26 DIAGNOSIS — E785 Hyperlipidemia, unspecified: Secondary | ICD-10-CM | POA: Diagnosis not present

## 2022-04-28 DIAGNOSIS — H5021 Vertical strabismus, right eye: Secondary | ICD-10-CM | POA: Diagnosis not present

## 2022-04-28 DIAGNOSIS — H532 Diplopia: Secondary | ICD-10-CM | POA: Diagnosis not present

## 2022-04-28 DIAGNOSIS — H5000 Unspecified esotropia: Secondary | ICD-10-CM | POA: Diagnosis not present

## 2022-05-03 DIAGNOSIS — E7800 Pure hypercholesterolemia, unspecified: Secondary | ICD-10-CM | POA: Diagnosis not present

## 2022-05-03 DIAGNOSIS — F419 Anxiety disorder, unspecified: Secondary | ICD-10-CM | POA: Diagnosis not present

## 2022-05-03 DIAGNOSIS — I351 Nonrheumatic aortic (valve) insufficiency: Secondary | ICD-10-CM | POA: Diagnosis not present

## 2022-05-03 DIAGNOSIS — G4700 Insomnia, unspecified: Secondary | ICD-10-CM | POA: Diagnosis not present

## 2022-05-03 DIAGNOSIS — F4321 Adjustment disorder with depressed mood: Secondary | ICD-10-CM | POA: Diagnosis not present

## 2022-05-03 DIAGNOSIS — Z0000 Encounter for general adult medical examination without abnormal findings: Secondary | ICD-10-CM | POA: Diagnosis not present

## 2022-05-03 DIAGNOSIS — I10 Essential (primary) hypertension: Secondary | ICD-10-CM | POA: Diagnosis not present

## 2022-05-07 DIAGNOSIS — Z23 Encounter for immunization: Secondary | ICD-10-CM | POA: Diagnosis not present

## 2022-05-07 DIAGNOSIS — H9042 Sensorineural hearing loss, unilateral, left ear, with unrestricted hearing on the contralateral side: Secondary | ICD-10-CM | POA: Diagnosis not present

## 2022-05-11 DIAGNOSIS — M19072 Primary osteoarthritis, left ankle and foot: Secondary | ICD-10-CM | POA: Diagnosis not present

## 2022-05-11 DIAGNOSIS — M25572 Pain in left ankle and joints of left foot: Secondary | ICD-10-CM | POA: Diagnosis not present

## 2022-05-11 DIAGNOSIS — M2011 Hallux valgus (acquired), right foot: Secondary | ICD-10-CM | POA: Diagnosis not present

## 2022-05-11 DIAGNOSIS — L851 Acquired keratosis [keratoderma] palmaris et plantaris: Secondary | ICD-10-CM | POA: Diagnosis not present

## 2022-05-11 DIAGNOSIS — M898X7 Other specified disorders of bone, ankle and foot: Secondary | ICD-10-CM | POA: Diagnosis not present

## 2022-05-11 DIAGNOSIS — M2012 Hallux valgus (acquired), left foot: Secondary | ICD-10-CM | POA: Diagnosis not present

## 2022-05-12 DIAGNOSIS — H16142 Punctate keratitis, left eye: Secondary | ICD-10-CM | POA: Diagnosis not present

## 2022-05-14 DIAGNOSIS — H16142 Punctate keratitis, left eye: Secondary | ICD-10-CM | POA: Diagnosis not present

## 2022-05-17 DIAGNOSIS — H16142 Punctate keratitis, left eye: Secondary | ICD-10-CM | POA: Diagnosis not present

## 2022-05-17 DIAGNOSIS — H0288B Meibomian gland dysfunction left eye, upper and lower eyelids: Secondary | ICD-10-CM | POA: Diagnosis not present

## 2022-08-04 DIAGNOSIS — H509 Unspecified strabismus: Secondary | ICD-10-CM | POA: Diagnosis not present

## 2022-08-20 DIAGNOSIS — M2042 Other hammer toe(s) (acquired), left foot: Secondary | ICD-10-CM | POA: Diagnosis not present

## 2022-08-20 DIAGNOSIS — M2011 Hallux valgus (acquired), right foot: Secondary | ICD-10-CM | POA: Diagnosis not present

## 2022-08-20 DIAGNOSIS — M2041 Other hammer toe(s) (acquired), right foot: Secondary | ICD-10-CM | POA: Diagnosis not present

## 2022-08-20 DIAGNOSIS — M2012 Hallux valgus (acquired), left foot: Secondary | ICD-10-CM | POA: Diagnosis not present

## 2022-08-20 DIAGNOSIS — L851 Acquired keratosis [keratoderma] palmaris et plantaris: Secondary | ICD-10-CM | POA: Diagnosis not present

## 2022-09-14 ENCOUNTER — Ambulatory Visit: Payer: Medicare Other | Attending: Cardiovascular Disease | Admitting: Cardiovascular Disease

## 2022-09-14 ENCOUNTER — Encounter: Payer: Self-pay | Admitting: Cardiovascular Disease

## 2022-09-14 VITALS — BP 134/90 | HR 49 | Ht 63.0 in | Wt 127.0 lb

## 2022-09-14 DIAGNOSIS — Z8249 Family history of ischemic heart disease and other diseases of the circulatory system: Secondary | ICD-10-CM

## 2022-09-14 DIAGNOSIS — I351 Nonrheumatic aortic (valve) insufficiency: Secondary | ICD-10-CM | POA: Diagnosis not present

## 2022-09-14 DIAGNOSIS — E782 Mixed hyperlipidemia: Secondary | ICD-10-CM

## 2022-09-14 NOTE — Assessment & Plan Note (Signed)
Moderate to severe aortic insufficiency by 2D echo performed 12/25/2021.  Her LV size and function are normal.  This represents slight progression of disease compared to prior echoes.  She is essentially asymptomatic.  I am going to recheck a 2D echo in May.

## 2022-09-14 NOTE — Patient Instructions (Signed)
Medication Instructions:  Your physician recommends that you continue on your current medications as directed. Please refer to the Current Medication list given to you today.  *If you need a refill on your cardiac medications before your next appointment, please call your pharmacy*   Testing/Procedures: Your physician has requested that you have an echocardiogram. Echocardiography is a painless test that uses sound waves to create images of your heart. It provides your doctor with information about the size and shape of your heart and how well your heart's chambers and valves are working. This procedure takes approximately one hour. There are no restrictions for this procedure. Please do NOT wear cologne, perfume, aftershave, or lotions (deodorant is allowed). Please arrive 15 minutes prior to your appointment time. This procedure will be done at [address] [place]  -To be done in May    Dr. Gwenlyn Found has ordered a CT coronary calcium score.   Test locations:  [place]   This is $99 out of pocket.   Coronary CalciumScan A coronary calcium scan is an imaging test used to look for deposits of calcium and other fatty materials (plaques) in the inner lining of the blood vessels of the heart (coronary arteries). These deposits of calcium and plaques can partly clog and narrow the coronary arteries without producing any symptoms or warning signs. This puts a person at risk for a heart attack. This test can detect these deposits before symptoms develop. Tell a health care provider about: Any allergies you have. All medicines you are taking, including vitamins, herbs, eye drops, creams, and over-the-counter medicines. Any problems you or family members have had with anesthetic medicines. Any blood disorders you have. Any surgeries you have had. Any medical conditions you have. Whether you are pregnant or may be pregnant. What are the risks? Generally,  this is a safe procedure. However, problems may occur, including: Harm to a pregnant woman and her unborn baby. This test involves the use of radiation. Radiation exposure can be dangerous to a pregnant woman and her unborn baby. If you are pregnant, you generally should not have this procedure done. Slight increase in the risk of cancer. This is because of the radiation involved in the test. What happens before the procedure? No preparation is needed for this procedure. What happens during the procedure? You will undress and remove any jewelry around your neck or chest. You will put on a hospital gown. Sticky electrodes will be placed on your chest. The electrodes will be connected to an electrocardiogram (ECG) machine to record a tracing of the electrical activity of your heart. A CT scanner will take pictures of your heart. During this time, you will be asked to lie still and hold your breath for 2-3 seconds while a picture of your heart is being taken. The procedure may vary among health care providers and hospitals. What happens after the procedure? You can get dressed. You can return to your normal activities. It is up to you to get the results of your test. Ask your health care provider, or the department that is doing the test, when your results will be ready. Summary A coronary calcium scan is an imaging test used to look for deposits of calcium and other fatty materials (plaques) in the inner lining of the blood vessels of the heart (coronary arteries). Generally, this is a safe procedure. Tell your health care provider if you are pregnant or may be pregnant. No preparation is needed  for this procedure. A CT scanner will take pictures of your heart. You can return to your normal activities after the scan is done. This information is not intended to replace advice given to you by your health care provider. Make sure you discuss any questions you have with your health care  provider. Document Released: 01/22/2008 Document Revised: 06/14/2016 Document Reviewed: 06/14/2016 Elsevier Interactive Patient Education  2017 [place]: At [facility], you and your health needs are our priority.  As part of our continuing mission to provide you with exceptional heart care, we have created designated Provider Care Teams.  These Care Teams include your primary Cardiologist (physician) and Advanced Practice Providers (APPs -  Physician Assistants and Nurse Practitioners) who all work together to provide you with the care you need, when you need it.  We recommend signing up for the patient portal called "MyChart".  Sign up information is provided on this After Visit Summary.  MyChart is used to connect with patients for Virtual Visits (Telemedicine).  Patients are able to view lab/test results, encounter notes, upcoming appointments, etc.  Non-urgent messages can be sent to your provider as well.   To learn more about what you can do with MyChart, go to [email or web address].    Your next appointment:   12 month(s)  Provider:   Quay Burow, MD

## 2022-09-14 NOTE — Progress Notes (Signed)
09/14/2022 Veronica Ho   12-07-1950  [id number]  Primary Physician Donnajean Lopes, MD Primary Cardiologist: Lorretta Harp MD Lupe Carney, [place]  HPI:  Veronica Ho is a 72 y.o.   thin appearing single Caucasian female with no children referred by Dr. Sharlett Iles for cardiovascular evaluation because of new onset dyspnea on exertion.  I last saw her in the office 08/13/2021.  She works part-time as an Futures trader.  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  She has no cardiac risk factors other than family history and hyperlipidemia.  Her father had a myocardial infarction age 71.  Her LDL is in the 140 range.  She works out regularly and has noticed increasing dyspnea on exertion while at the gym.     She saw Coletta Memos, FNP in the office in April of last year for similar symptoms.  A follow-up 2D echo showed normal LV systolic function, grade 1 diastolic dysfunction and moderate AI.  She was seen in the ER in [place] at the end of last month with shortness of breath and pleuritic chest pain.  A chest CTA was unremarkable troponins were negative.  She has a chronically slow heart rate with sinus bradycardia in the 50s and lateral T wave inversion which has not changed.  She says that she works out in [employer] several times a week doing recumbent bike and during this time she denies shortness of breath.  Since I saw her in the office a year ago she is remained stable.  Her last 2D echo performed 12/25/2021 revealed normal LV size and function with moderate to the insufficiency representing slight worsening of disease compared to prior echoes although she has remained asymptomatic.  She specifically denies chest pain.   Current Meds  Medication Sig   ALPRAZolam (XANAX) 0.5 MG tablet Take 0.5-1 tablets (0.25-0.5 mg total) by mouth at bedtime.   ammonium lactate (LAC-HYDRIN) 12 % lotion Apply 1 Application topically as needed.   Biotin 2500 MCG CAPS Take 1  capsule by mouth daily.   chlorhexidine (PERIDEX) 0.12 % solution Use as directed 5 mLs in the mouth or throat as needed.   escitalopram (LEXAPRO) 10 MG tablet Take 10 mg by mouth daily.   HYDROcodone-acetaminophen (NORCO/VICODIN) 5-325 MG tablet Take 1-2 tablets by mouth 3 (three) times daily as needed.   Multiple Vitamins-Minerals (MULTIVITAMIN WITH MINERALS) tablet Take 1 tablet by mouth daily.   oxyCODONE (OXY IR/ROXICODONE) 5 MG immediate release tablet Take 1 tablet by mouth daily as needed.   rosuvastatin (CRESTOR) 10 MG tablet Take 10 mg by mouth daily.     Allergies  Allergen Reactions   Propranolol     Other reaction(s): Fluid retention   Rivaroxaban Rash    Social History   Socioeconomic History   Marital status: Single    Spouse name: Not on file   Number of children: Not on file   Years of education: Not on file   Highest education level: Not on file  Occupational History   Not on file  Tobacco Use   Smoking status: Never   Smokeless tobacco: Never  Vaping Use   Vaping Use: Never used  Substance and Sexual Activity   Alcohol use: No   Drug use: No   Sexual activity: Yes  Other Topics Concern   Not on file  Social History Narrative   Not on file   Social Determinants of Health   Financial Resource Strain: Not  on file  Food Insecurity: Not on file  Transportation Needs: Not on file  Physical Activity: Not on file  Stress: Not on file  Social Connections: Not on file  Intimate Partner Violence: Not on file     Review of Systems: General: negative for chills, fever, night sweats or weight changes.  Cardiovascular: negative for chest pain, dyspnea on exertion, edema, orthopnea, palpitations, paroxysmal nocturnal dyspnea or shortness of breath Dermatological: negative for rash Respiratory: negative for cough or wheezing Urologic: negative for hematuria Abdominal: negative for nausea, vomiting, diarrhea, bright red blood per rectum, melena, or  hematemesis Neurologic: negative for visual changes, syncope, or dizziness All other systems reviewed and are otherwise negative except as noted above.    Blood pressure (!) 134/90, pulse (!) 49, height '5\' 3"'$  (1.6 m), weight 127 lb (57.6 kg), SpO2 97 %.  General appearance: alert and no distress Neck: no adenopathy, no carotid bruit, no JVD, supple, symmetrical, trachea midline, and thyroid not enlarged, symmetric, no tenderness/mass/nodules Lungs: clear to auscultation bilaterally Heart: regular rate and rhythm, S1, S2 normal, no murmur, click, rub or gallop Extremities: extremities normal, atraumatic, no cyanosis or edema Pulses: 2+ and symmetric Skin: Skin color, texture, turgor normal. No rashes or lesions Neurologic: Grossly normal  EKG sinus bradycardia 49 with inferior and anterolateral T wave inversion unchanged from prior EKGs.  I personally reviewed this EKG.  ASSESSMENT AND PLAN:   Hyperlipidemia History of hyperlipidemia on Crestor with lipid profile performed 04/26/2022 revealing a total cholesterol of 177, LDL of 85 and HDL of 83.  Family history of heart disease Father had myocardial infarction at age 4.  Moderate aortic valve insufficiency Moderate to severe aortic insufficiency by 2D echo performed 12/25/2021.  Her LV size and function are normal.  This represents slight progression of disease compared to prior echoes.  She is essentially asymptomatic.  I am going to recheck a 2D echo in May.     Lorretta Harp MD FACP,FACC,FAHA, [facility] 09/14/2022 3:26 PM

## 2022-09-14 NOTE — Assessment & Plan Note (Signed)
Father had myocardial infarction at age 73.

## 2022-09-14 NOTE — Assessment & Plan Note (Signed)
History of hyperlipidemia on Crestor with lipid profile performed 04/26/2022 revealing a total cholesterol of 177, LDL of 85 and HDL of 83.

## 2022-09-30 DIAGNOSIS — I10 Essential (primary) hypertension: Secondary | ICD-10-CM | POA: Diagnosis not present

## 2022-09-30 DIAGNOSIS — J4530 Mild persistent asthma, uncomplicated: Secondary | ICD-10-CM | POA: Diagnosis not present

## 2022-09-30 DIAGNOSIS — L659 Nonscarring hair loss, unspecified: Secondary | ICD-10-CM | POA: Diagnosis not present

## 2022-09-30 DIAGNOSIS — R053 Chronic cough: Secondary | ICD-10-CM | POA: Diagnosis not present

## 2022-09-30 DIAGNOSIS — E785 Hyperlipidemia, unspecified: Secondary | ICD-10-CM | POA: Diagnosis not present

## 2022-10-05 DIAGNOSIS — L649 Androgenic alopecia, unspecified: Secondary | ICD-10-CM | POA: Diagnosis not present

## 2022-10-05 DIAGNOSIS — L821 Other seborrheic keratosis: Secondary | ICD-10-CM | POA: Diagnosis not present

## 2022-10-05 DIAGNOSIS — Z85828 Personal history of other malignant neoplasm of skin: Secondary | ICD-10-CM | POA: Diagnosis not present

## 2022-10-05 DIAGNOSIS — L853 Xerosis cutis: Secondary | ICD-10-CM | POA: Diagnosis not present

## 2022-10-05 DIAGNOSIS — L820 Inflamed seborrheic keratosis: Secondary | ICD-10-CM | POA: Diagnosis not present

## 2022-10-05 DIAGNOSIS — D485 Neoplasm of uncertain behavior of skin: Secondary | ICD-10-CM | POA: Diagnosis not present

## 2022-10-14 ENCOUNTER — Ambulatory Visit (HOSPITAL_BASED_OUTPATIENT_CLINIC_OR_DEPARTMENT_OTHER)
Admission: RE | Admit: 2022-10-14 | Discharge: 2022-10-14 | Disposition: A | Discharge status: Home / Self Care | Payer: Medicare Other | Source: Ambulatory Visit | Attending: Cardiovascular Disease | Admitting: Cardiovascular Disease

## 2022-10-14 DIAGNOSIS — E782 Mixed hyperlipidemia: Secondary | ICD-10-CM | POA: Insufficient documentation

## 2022-10-14 DIAGNOSIS — I351 Nonrheumatic aortic (valve) insufficiency: Secondary | ICD-10-CM | POA: Insufficient documentation

## 2022-10-14 DIAGNOSIS — Z8249 Family history of ischemic heart disease and other diseases of the circulatory system: Secondary | ICD-10-CM | POA: Insufficient documentation

## 2022-10-21 DIAGNOSIS — L219 Seborrheic dermatitis, unspecified: Secondary | ICD-10-CM | POA: Diagnosis not present

## 2022-10-21 DIAGNOSIS — L814 Other melanin hyperpigmentation: Secondary | ICD-10-CM | POA: Diagnosis not present

## 2022-10-21 DIAGNOSIS — L570 Actinic keratosis: Secondary | ICD-10-CM | POA: Diagnosis not present

## 2022-10-21 DIAGNOSIS — L821 Other seborrheic keratosis: Secondary | ICD-10-CM | POA: Diagnosis not present

## 2022-10-21 DIAGNOSIS — L300 Nummular dermatitis: Secondary | ICD-10-CM | POA: Diagnosis not present

## 2022-11-09 DIAGNOSIS — L851 Acquired keratosis [keratoderma] palmaris et plantaris: Secondary | ICD-10-CM | POA: Diagnosis not present

## 2022-11-09 DIAGNOSIS — M2011 Hallux valgus (acquired), right foot: Secondary | ICD-10-CM | POA: Diagnosis not present

## 2022-11-09 DIAGNOSIS — M2042 Other hammer toe(s) (acquired), left foot: Secondary | ICD-10-CM | POA: Diagnosis not present

## 2022-11-09 DIAGNOSIS — M2012 Hallux valgus (acquired), left foot: Secondary | ICD-10-CM | POA: Diagnosis not present

## 2022-11-09 DIAGNOSIS — M2041 Other hammer toe(s) (acquired), right foot: Secondary | ICD-10-CM | POA: Diagnosis not present

## 2022-11-29 DIAGNOSIS — J209 Acute bronchitis, unspecified: Secondary | ICD-10-CM | POA: Diagnosis not present

## 2022-11-29 DIAGNOSIS — J4530 Mild persistent asthma, uncomplicated: Secondary | ICD-10-CM | POA: Diagnosis not present

## 2022-11-29 DIAGNOSIS — R058 Other specified cough: Secondary | ICD-10-CM | POA: Diagnosis not present

## 2022-12-02 DIAGNOSIS — J209 Acute bronchitis, unspecified: Secondary | ICD-10-CM | POA: Diagnosis not present

## 2022-12-06 DIAGNOSIS — R0781 Pleurodynia: Secondary | ICD-10-CM | POA: Diagnosis not present

## 2022-12-06 DIAGNOSIS — R062 Wheezing: Secondary | ICD-10-CM | POA: Diagnosis not present

## 2022-12-06 DIAGNOSIS — J209 Acute bronchitis, unspecified: Secondary | ICD-10-CM | POA: Diagnosis not present

## 2022-12-20 DIAGNOSIS — R053 Chronic cough: Secondary | ICD-10-CM | POA: Diagnosis not present

## 2022-12-20 DIAGNOSIS — R519 Headache, unspecified: Secondary | ICD-10-CM | POA: Diagnosis not present

## 2022-12-21 ENCOUNTER — Encounter: Payer: Self-pay | Admitting: Physician Assistant

## 2022-12-21 ENCOUNTER — Other Ambulatory Visit: Payer: Self-pay | Admitting: Physician Assistant

## 2022-12-21 DIAGNOSIS — R053 Chronic cough: Secondary | ICD-10-CM

## 2022-12-23 ENCOUNTER — Ambulatory Visit
Admission: RE | Admit: 2022-12-23 | Discharge: 2022-12-23 | Disposition: A | Discharge status: Home / Self Care | Payer: Medicare Other | Source: Ambulatory Visit | Attending: Physician Assistant | Admitting: Physician Assistant

## 2022-12-23 DIAGNOSIS — R0989 Other specified symptoms and signs involving the circulatory and respiratory systems: Secondary | ICD-10-CM | POA: Diagnosis not present

## 2022-12-23 DIAGNOSIS — R053 Chronic cough: Secondary | ICD-10-CM | POA: Diagnosis not present

## 2023-01-07 ENCOUNTER — Ambulatory Visit (HOSPITAL_COMMUNITY): Payer: Medicare Other

## 2023-01-11 DIAGNOSIS — M2011 Hallux valgus (acquired), right foot: Secondary | ICD-10-CM | POA: Diagnosis not present

## 2023-01-11 DIAGNOSIS — M2041 Other hammer toe(s) (acquired), right foot: Secondary | ICD-10-CM | POA: Diagnosis not present

## 2023-01-11 DIAGNOSIS — L851 Acquired keratosis [keratoderma] palmaris et plantaris: Secondary | ICD-10-CM | POA: Diagnosis not present

## 2023-01-11 DIAGNOSIS — M2042 Other hammer toe(s) (acquired), left foot: Secondary | ICD-10-CM | POA: Diagnosis not present

## 2023-01-11 DIAGNOSIS — M2012 Hallux valgus (acquired), left foot: Secondary | ICD-10-CM | POA: Diagnosis not present

## 2023-01-26 DIAGNOSIS — L820 Inflamed seborrheic keratosis: Secondary | ICD-10-CM | POA: Diagnosis not present

## 2023-01-26 DIAGNOSIS — L578 Other skin changes due to chronic exposure to nonionizing radiation: Secondary | ICD-10-CM | POA: Diagnosis not present

## 2023-01-26 DIAGNOSIS — D225 Melanocytic nevi of trunk: Secondary | ICD-10-CM | POA: Diagnosis not present

## 2023-01-26 DIAGNOSIS — L814 Other melanin hyperpigmentation: Secondary | ICD-10-CM | POA: Diagnosis not present

## 2023-01-26 DIAGNOSIS — L300 Nummular dermatitis: Secondary | ICD-10-CM | POA: Diagnosis not present

## 2023-02-02 DIAGNOSIS — Z1231 Encounter for screening mammogram for malignant neoplasm of breast: Secondary | ICD-10-CM | POA: Diagnosis not present

## 2023-02-02 DIAGNOSIS — R92323 Mammographic fibroglandular density, bilateral breasts: Secondary | ICD-10-CM | POA: Diagnosis not present

## 2023-03-15 DIAGNOSIS — M2012 Hallux valgus (acquired), left foot: Secondary | ICD-10-CM | POA: Diagnosis not present

## 2023-03-15 DIAGNOSIS — M2011 Hallux valgus (acquired), right foot: Secondary | ICD-10-CM | POA: Diagnosis not present

## 2023-03-15 DIAGNOSIS — M2041 Other hammer toe(s) (acquired), right foot: Secondary | ICD-10-CM | POA: Diagnosis not present

## 2023-03-15 DIAGNOSIS — L851 Acquired keratosis [keratoderma] palmaris et plantaris: Secondary | ICD-10-CM | POA: Diagnosis not present

## 2023-03-15 DIAGNOSIS — M2042 Other hammer toe(s) (acquired), left foot: Secondary | ICD-10-CM | POA: Diagnosis not present

## 2023-03-30 DIAGNOSIS — L821 Other seborrheic keratosis: Secondary | ICD-10-CM | POA: Diagnosis not present

## 2023-03-30 DIAGNOSIS — D4989 Neoplasm of unspecified behavior of other specified sites: Secondary | ICD-10-CM | POA: Diagnosis not present

## 2023-03-30 DIAGNOSIS — Z85828 Personal history of other malignant neoplasm of skin: Secondary | ICD-10-CM | POA: Diagnosis not present

## 2023-03-30 DIAGNOSIS — D485 Neoplasm of uncertain behavior of skin: Secondary | ICD-10-CM | POA: Diagnosis not present

## 2023-03-30 DIAGNOSIS — D225 Melanocytic nevi of trunk: Secondary | ICD-10-CM | POA: Diagnosis not present

## 2023-03-30 DIAGNOSIS — L300 Nummular dermatitis: Secondary | ICD-10-CM | POA: Diagnosis not present

## 2023-04-25 DIAGNOSIS — Z961 Presence of intraocular lens: Secondary | ICD-10-CM | POA: Diagnosis not present

## 2023-04-25 DIAGNOSIS — H5050 Unspecified heterophoria: Secondary | ICD-10-CM | POA: Diagnosis not present

## 2023-04-27 DIAGNOSIS — L988 Other specified disorders of the skin and subcutaneous tissue: Secondary | ICD-10-CM | POA: Diagnosis not present

## 2023-04-27 DIAGNOSIS — Z85828 Personal history of other malignant neoplasm of skin: Secondary | ICD-10-CM | POA: Diagnosis not present

## 2023-04-27 DIAGNOSIS — D485 Neoplasm of uncertain behavior of skin: Secondary | ICD-10-CM | POA: Diagnosis not present

## 2023-05-10 DIAGNOSIS — L570 Actinic keratosis: Secondary | ICD-10-CM | POA: Diagnosis not present

## 2023-05-10 DIAGNOSIS — E7800 Pure hypercholesterolemia, unspecified: Secondary | ICD-10-CM | POA: Diagnosis not present

## 2023-05-10 DIAGNOSIS — E785 Hyperlipidemia, unspecified: Secondary | ICD-10-CM | POA: Diagnosis not present

## 2023-05-10 DIAGNOSIS — I10 Essential (primary) hypertension: Secondary | ICD-10-CM | POA: Diagnosis not present

## 2023-05-10 DIAGNOSIS — Z1212 Encounter for screening for malignant neoplasm of rectum: Secondary | ICD-10-CM | POA: Diagnosis not present

## 2023-05-13 DIAGNOSIS — L84 Corns and callosities: Secondary | ICD-10-CM | POA: Diagnosis not present

## 2023-05-13 DIAGNOSIS — H16142 Punctate keratitis, left eye: Secondary | ICD-10-CM | POA: Diagnosis not present

## 2023-05-13 DIAGNOSIS — M79671 Pain in right foot: Secondary | ICD-10-CM | POA: Diagnosis not present

## 2023-05-13 DIAGNOSIS — H04123 Dry eye syndrome of bilateral lacrimal glands: Secondary | ICD-10-CM | POA: Diagnosis not present

## 2023-05-16 DIAGNOSIS — R82998 Other abnormal findings in urine: Secondary | ICD-10-CM | POA: Diagnosis not present

## 2023-05-16 DIAGNOSIS — I10 Essential (primary) hypertension: Secondary | ICD-10-CM | POA: Diagnosis not present

## 2023-05-16 DIAGNOSIS — M810 Age-related osteoporosis without current pathological fracture: Secondary | ICD-10-CM | POA: Diagnosis not present

## 2023-05-16 DIAGNOSIS — I351 Nonrheumatic aortic (valve) insufficiency: Secondary | ICD-10-CM | POA: Diagnosis not present

## 2023-05-16 DIAGNOSIS — E785 Hyperlipidemia, unspecified: Secondary | ICD-10-CM | POA: Diagnosis not present

## 2023-05-16 DIAGNOSIS — Z0000 Encounter for general adult medical examination without abnormal findings: Secondary | ICD-10-CM | POA: Diagnosis not present

## 2023-05-16 DIAGNOSIS — Z23 Encounter for immunization: Secondary | ICD-10-CM | POA: Diagnosis not present

## 2023-05-16 DIAGNOSIS — F419 Anxiety disorder, unspecified: Secondary | ICD-10-CM | POA: Diagnosis not present

## 2023-05-30 DIAGNOSIS — L84 Corns and callosities: Secondary | ICD-10-CM | POA: Diagnosis not present

## 2023-06-16 DIAGNOSIS — Z471 Aftercare following joint replacement surgery: Secondary | ICD-10-CM | POA: Diagnosis not present

## 2023-06-16 DIAGNOSIS — M2011 Hallux valgus (acquired), right foot: Secondary | ICD-10-CM | POA: Diagnosis not present

## 2023-06-16 DIAGNOSIS — M2012 Hallux valgus (acquired), left foot: Secondary | ICD-10-CM | POA: Diagnosis not present

## 2023-06-16 DIAGNOSIS — M25571 Pain in right ankle and joints of right foot: Secondary | ICD-10-CM | POA: Diagnosis not present

## 2023-06-16 DIAGNOSIS — M25561 Pain in right knee: Secondary | ICD-10-CM | POA: Diagnosis not present

## 2023-06-16 DIAGNOSIS — M2041 Other hammer toe(s) (acquired), right foot: Secondary | ICD-10-CM | POA: Diagnosis not present

## 2023-06-16 DIAGNOSIS — Z96653 Presence of artificial knee joint, bilateral: Secondary | ICD-10-CM | POA: Diagnosis not present

## 2023-06-16 DIAGNOSIS — M19071 Primary osteoarthritis, right ankle and foot: Secondary | ICD-10-CM | POA: Diagnosis not present

## 2023-06-16 DIAGNOSIS — M25562 Pain in left knee: Secondary | ICD-10-CM | POA: Diagnosis not present

## 2023-06-24 ENCOUNTER — Telehealth: Payer: Self-pay | Admitting: Cardiovascular Disease

## 2023-06-24 NOTE — Telephone Encounter (Signed)
Veronica Ho with [facility] Medical records called in stating pt was promised by someone here that she would receive her medical records via a flashdrive which she is not able to do as far as images. Please advise where pt can get these images downloaded on a flashdrive. My team lead suggested she may need to call the actual facility she had the test done. Please advise.

## 2023-07-25 NOTE — Telephone Encounter (Signed)
Patient came back to front desk today inquiring about flash drive images.Previously spoke with medical records and medical records was told that someone from the office would reach out to the patient, but patient is saying she hasn't heard anything back and has an upcoming physical therapy appointment this week.  The information she is requesting on the flash drive is all information acquired since becoming a patient of provider.

## 2023-07-27 DIAGNOSIS — M25512 Pain in left shoulder: Secondary | ICD-10-CM | POA: Diagnosis not present

## 2023-07-27 DIAGNOSIS — M542 Cervicalgia: Secondary | ICD-10-CM | POA: Diagnosis not present

## 2023-07-27 DIAGNOSIS — M25511 Pain in right shoulder: Secondary | ICD-10-CM | POA: Diagnosis not present

## 2023-07-28 DIAGNOSIS — L84 Corns and callosities: Secondary | ICD-10-CM | POA: Diagnosis not present

## 2023-07-28 DIAGNOSIS — M2041 Other hammer toe(s) (acquired), right foot: Secondary | ICD-10-CM | POA: Diagnosis not present

## 2023-07-28 DIAGNOSIS — R52 Pain, unspecified: Secondary | ICD-10-CM | POA: Diagnosis not present

## 2023-07-29 DIAGNOSIS — M25562 Pain in left knee: Secondary | ICD-10-CM | POA: Diagnosis not present

## 2023-07-29 DIAGNOSIS — M25561 Pain in right knee: Secondary | ICD-10-CM | POA: Diagnosis not present

## 2023-08-05 DIAGNOSIS — L84 Corns and callosities: Secondary | ICD-10-CM | POA: Diagnosis not present

## 2023-08-05 DIAGNOSIS — M205X1 Other deformities of toe(s) (acquired), right foot: Secondary | ICD-10-CM | POA: Diagnosis not present

## 2023-08-05 DIAGNOSIS — F419 Anxiety disorder, unspecified: Secondary | ICD-10-CM | POA: Diagnosis not present

## 2023-08-05 DIAGNOSIS — Z96653 Presence of artificial knee joint, bilateral: Secondary | ICD-10-CM | POA: Diagnosis not present

## 2023-08-05 DIAGNOSIS — M2041 Other hammer toe(s) (acquired), right foot: Secondary | ICD-10-CM | POA: Diagnosis not present

## 2023-08-05 DIAGNOSIS — Z79899 Other long term (current) drug therapy: Secondary | ICD-10-CM | POA: Diagnosis not present

## 2023-08-05 DIAGNOSIS — M2042 Other hammer toe(s) (acquired), left foot: Secondary | ICD-10-CM | POA: Diagnosis not present

## 2023-08-30 DIAGNOSIS — M79674 Pain in right toe(s): Secondary | ICD-10-CM | POA: Diagnosis not present

## 2023-08-30 DIAGNOSIS — L84 Corns and callosities: Secondary | ICD-10-CM | POA: Diagnosis not present

## 2023-09-06 DIAGNOSIS — M7542 Impingement syndrome of left shoulder: Secondary | ICD-10-CM | POA: Diagnosis not present

## 2023-09-06 DIAGNOSIS — M7541 Impingement syndrome of right shoulder: Secondary | ICD-10-CM | POA: Diagnosis not present

## 2023-09-06 DIAGNOSIS — M542 Cervicalgia: Secondary | ICD-10-CM | POA: Diagnosis not present

## 2023-09-22 DIAGNOSIS — M25512 Pain in left shoulder: Secondary | ICD-10-CM | POA: Diagnosis not present

## 2023-09-22 DIAGNOSIS — M25511 Pain in right shoulder: Secondary | ICD-10-CM | POA: Diagnosis not present

## 2023-09-23 DIAGNOSIS — Z981 Arthrodesis status: Secondary | ICD-10-CM | POA: Diagnosis not present

## 2023-09-23 DIAGNOSIS — M7989 Other specified soft tissue disorders: Secondary | ICD-10-CM | POA: Diagnosis not present

## 2023-09-23 DIAGNOSIS — L03116 Cellulitis of left lower limb: Secondary | ICD-10-CM | POA: Diagnosis not present

## 2023-09-30 DIAGNOSIS — M2042 Other hammer toe(s) (acquired), left foot: Secondary | ICD-10-CM | POA: Diagnosis not present

## 2023-09-30 DIAGNOSIS — S91101A Unspecified open wound of right great toe without damage to nail, initial encounter: Secondary | ICD-10-CM | POA: Diagnosis not present

## 2023-09-30 DIAGNOSIS — T8189XA Other complications of procedures, not elsewhere classified, initial encounter: Secondary | ICD-10-CM | POA: Diagnosis not present

## 2023-09-30 DIAGNOSIS — L84 Corns and callosities: Secondary | ICD-10-CM | POA: Diagnosis not present

## 2023-09-30 DIAGNOSIS — M2041 Other hammer toe(s) (acquired), right foot: Secondary | ICD-10-CM | POA: Diagnosis not present

## 2023-10-04 DIAGNOSIS — M79671 Pain in right foot: Secondary | ICD-10-CM | POA: Diagnosis not present

## 2023-10-04 DIAGNOSIS — M810 Age-related osteoporosis without current pathological fracture: Secondary | ICD-10-CM | POA: Diagnosis not present

## 2023-10-10 DIAGNOSIS — M19011 Primary osteoarthritis, right shoulder: Secondary | ICD-10-CM | POA: Diagnosis not present

## 2023-10-10 DIAGNOSIS — M19012 Primary osteoarthritis, left shoulder: Secondary | ICD-10-CM | POA: Diagnosis not present

## 2023-10-17 DIAGNOSIS — M25511 Pain in right shoulder: Secondary | ICD-10-CM | POA: Diagnosis not present

## 2023-10-17 DIAGNOSIS — M25512 Pain in left shoulder: Secondary | ICD-10-CM | POA: Diagnosis not present

## 2023-11-22 DIAGNOSIS — E785 Hyperlipidemia, unspecified: Secondary | ICD-10-CM | POA: Diagnosis not present

## 2023-11-22 DIAGNOSIS — J029 Acute pharyngitis, unspecified: Secondary | ICD-10-CM | POA: Diagnosis not present

## 2023-11-22 DIAGNOSIS — L049 Acute lymphadenitis, unspecified: Secondary | ICD-10-CM | POA: Diagnosis not present

## 2023-11-22 DIAGNOSIS — J4530 Mild persistent asthma, uncomplicated: Secondary | ICD-10-CM | POA: Diagnosis not present

## 2023-11-22 DIAGNOSIS — R051 Acute cough: Secondary | ICD-10-CM | POA: Diagnosis not present

## 2023-11-22 DIAGNOSIS — I10 Essential (primary) hypertension: Secondary | ICD-10-CM | POA: Diagnosis not present

## 2023-12-06 DIAGNOSIS — R197 Diarrhea, unspecified: Secondary | ICD-10-CM | POA: Diagnosis not present

## 2023-12-09 DIAGNOSIS — A09 Infectious gastroenteritis and colitis, unspecified: Secondary | ICD-10-CM | POA: Diagnosis not present

## 2023-12-09 DIAGNOSIS — L249 Irritant contact dermatitis, unspecified cause: Secondary | ICD-10-CM | POA: Diagnosis not present

## 2023-12-09 DIAGNOSIS — I10 Essential (primary) hypertension: Secondary | ICD-10-CM | POA: Diagnosis not present

## 2023-12-09 DIAGNOSIS — A0472 Enterocolitis due to Clostridium difficile, not specified as recurrent: Secondary | ICD-10-CM | POA: Diagnosis not present

## 2023-12-09 DIAGNOSIS — R21 Rash and other nonspecific skin eruption: Secondary | ICD-10-CM | POA: Diagnosis not present

## 2023-12-30 DIAGNOSIS — G8929 Other chronic pain: Secondary | ICD-10-CM | POA: Diagnosis not present

## 2023-12-30 DIAGNOSIS — M79601 Pain in right arm: Secondary | ICD-10-CM | POA: Diagnosis not present

## 2023-12-30 DIAGNOSIS — M79641 Pain in right hand: Secondary | ICD-10-CM | POA: Diagnosis not present

## 2023-12-30 DIAGNOSIS — M25511 Pain in right shoulder: Secondary | ICD-10-CM | POA: Diagnosis not present

## 2024-01-13 DIAGNOSIS — M1811 Unilateral primary osteoarthritis of first carpometacarpal joint, right hand: Secondary | ICD-10-CM | POA: Diagnosis not present

## 2024-01-13 DIAGNOSIS — M19031 Primary osteoarthritis, right wrist: Secondary | ICD-10-CM | POA: Diagnosis not present

## 2024-01-30 DIAGNOSIS — M19012 Primary osteoarthritis, left shoulder: Secondary | ICD-10-CM | POA: Diagnosis not present

## 2024-01-30 DIAGNOSIS — M19011 Primary osteoarthritis, right shoulder: Secondary | ICD-10-CM | POA: Diagnosis not present

## 2024-02-03 DIAGNOSIS — Z1231 Encounter for screening mammogram for malignant neoplasm of breast: Secondary | ICD-10-CM | POA: Diagnosis not present

## 2024-02-03 DIAGNOSIS — R92333 Mammographic heterogeneous density, bilateral breasts: Secondary | ICD-10-CM | POA: Diagnosis not present

## 2024-02-09 DIAGNOSIS — H04123 Dry eye syndrome of bilateral lacrimal glands: Secondary | ICD-10-CM | POA: Diagnosis not present

## 2024-02-09 DIAGNOSIS — H16142 Punctate keratitis, left eye: Secondary | ICD-10-CM | POA: Diagnosis not present

## 2024-03-14 DIAGNOSIS — L814 Other melanin hyperpigmentation: Secondary | ICD-10-CM | POA: Diagnosis not present

## 2024-03-14 DIAGNOSIS — L570 Actinic keratosis: Secondary | ICD-10-CM | POA: Diagnosis not present

## 2024-03-14 DIAGNOSIS — D485 Neoplasm of uncertain behavior of skin: Secondary | ICD-10-CM | POA: Diagnosis not present

## 2024-04-02 DIAGNOSIS — D225 Melanocytic nevi of trunk: Secondary | ICD-10-CM | POA: Diagnosis not present

## 2024-04-02 DIAGNOSIS — L905 Scar conditions and fibrosis of skin: Secondary | ICD-10-CM | POA: Diagnosis not present

## 2024-04-02 DIAGNOSIS — D2262 Melanocytic nevi of left upper limb, including shoulder: Secondary | ICD-10-CM | POA: Diagnosis not present

## 2024-04-02 DIAGNOSIS — D2261 Melanocytic nevi of right upper limb, including shoulder: Secondary | ICD-10-CM | POA: Diagnosis not present

## 2024-04-02 DIAGNOSIS — L650 Telogen effluvium: Secondary | ICD-10-CM | POA: Diagnosis not present

## 2024-04-02 DIAGNOSIS — D485 Neoplasm of uncertain behavior of skin: Secondary | ICD-10-CM | POA: Diagnosis not present

## 2024-04-02 DIAGNOSIS — Z85828 Personal history of other malignant neoplasm of skin: Secondary | ICD-10-CM | POA: Diagnosis not present

## 2024-04-02 DIAGNOSIS — L821 Other seborrheic keratosis: Secondary | ICD-10-CM | POA: Diagnosis not present

## 2024-04-02 DIAGNOSIS — L565 Disseminated superficial actinic porokeratosis (DSAP): Secondary | ICD-10-CM | POA: Diagnosis not present

## 2024-04-02 DIAGNOSIS — L570 Actinic keratosis: Secondary | ICD-10-CM | POA: Diagnosis not present

## 2024-04-10 DIAGNOSIS — M79644 Pain in right finger(s): Secondary | ICD-10-CM | POA: Diagnosis not present

## 2024-04-10 DIAGNOSIS — M25531 Pain in right wrist: Secondary | ICD-10-CM | POA: Diagnosis not present

## 2024-04-11 DIAGNOSIS — R0981 Nasal congestion: Secondary | ICD-10-CM | POA: Diagnosis not present

## 2024-04-11 DIAGNOSIS — R058 Other specified cough: Secondary | ICD-10-CM | POA: Diagnosis not present

## 2024-04-11 DIAGNOSIS — U071 COVID-19: Secondary | ICD-10-CM | POA: Diagnosis not present

## 2024-04-11 DIAGNOSIS — J4530 Mild persistent asthma, uncomplicated: Secondary | ICD-10-CM | POA: Diagnosis not present

## 2024-04-11 DIAGNOSIS — J029 Acute pharyngitis, unspecified: Secondary | ICD-10-CM | POA: Diagnosis not present

## 2024-04-11 DIAGNOSIS — Z1152 Encounter for screening for COVID-19: Secondary | ICD-10-CM | POA: Diagnosis not present

## 2024-04-11 DIAGNOSIS — R110 Nausea: Secondary | ICD-10-CM | POA: Diagnosis not present

## 2024-04-19 DIAGNOSIS — K5909 Other constipation: Secondary | ICD-10-CM | POA: Diagnosis not present

## 2024-04-19 DIAGNOSIS — G8929 Other chronic pain: Secondary | ICD-10-CM | POA: Diagnosis not present

## 2024-04-19 DIAGNOSIS — L659 Nonscarring hair loss, unspecified: Secondary | ICD-10-CM | POA: Diagnosis not present

## 2024-04-19 DIAGNOSIS — M25511 Pain in right shoulder: Secondary | ICD-10-CM | POA: Diagnosis not present

## 2024-04-19 DIAGNOSIS — H6122 Impacted cerumen, left ear: Secondary | ICD-10-CM | POA: Diagnosis not present

## 2024-04-19 DIAGNOSIS — M25512 Pain in left shoulder: Secondary | ICD-10-CM | POA: Diagnosis not present

## 2024-04-24 DIAGNOSIS — M25512 Pain in left shoulder: Secondary | ICD-10-CM | POA: Diagnosis not present

## 2024-04-24 DIAGNOSIS — M25511 Pain in right shoulder: Secondary | ICD-10-CM | POA: Diagnosis not present

## 2024-04-25 ENCOUNTER — Other Ambulatory Visit: Payer: Self-pay | Admitting: Orthopaedic Surgery

## 2024-04-25 DIAGNOSIS — M25511 Pain in right shoulder: Secondary | ICD-10-CM

## 2024-05-03 DIAGNOSIS — L578 Other skin changes due to chronic exposure to nonionizing radiation: Secondary | ICD-10-CM | POA: Diagnosis not present

## 2024-05-03 DIAGNOSIS — Z85828 Personal history of other malignant neoplasm of skin: Secondary | ICD-10-CM | POA: Diagnosis not present

## 2024-05-09 ENCOUNTER — Ambulatory Visit
Admission: RE | Admit: 2024-05-09 | Discharge: 2024-05-09 | Disposition: A | Discharge status: Home / Self Care | Source: Ambulatory Visit | Attending: Orthopaedic Surgery | Admitting: Orthopaedic Surgery

## 2024-05-09 DIAGNOSIS — M25511 Pain in right shoulder: Secondary | ICD-10-CM

## 2024-05-10 DIAGNOSIS — M19011 Primary osteoarthritis, right shoulder: Secondary | ICD-10-CM | POA: Diagnosis not present

## 2024-05-10 DIAGNOSIS — M75111 Incomplete rotator cuff tear or rupture of right shoulder, not specified as traumatic: Secondary | ICD-10-CM | POA: Diagnosis not present

## 2024-05-14 DIAGNOSIS — R82998 Other abnormal findings in urine: Secondary | ICD-10-CM | POA: Diagnosis not present

## 2024-05-14 DIAGNOSIS — H43813 Vitreous degeneration, bilateral: Secondary | ICD-10-CM | POA: Diagnosis not present

## 2024-05-14 DIAGNOSIS — Z961 Presence of intraocular lens: Secondary | ICD-10-CM | POA: Diagnosis not present

## 2024-05-14 DIAGNOSIS — E559 Vitamin D deficiency, unspecified: Secondary | ICD-10-CM | POA: Diagnosis not present

## 2024-05-14 DIAGNOSIS — Z1212 Encounter for screening for malignant neoplasm of rectum: Secondary | ICD-10-CM | POA: Diagnosis not present

## 2024-05-14 DIAGNOSIS — E785 Hyperlipidemia, unspecified: Secondary | ICD-10-CM | POA: Diagnosis not present

## 2024-05-14 DIAGNOSIS — H35373 Puckering of macula, bilateral: Secondary | ICD-10-CM | POA: Diagnosis not present

## 2024-05-14 DIAGNOSIS — H5050 Unspecified heterophoria: Secondary | ICD-10-CM | POA: Diagnosis not present

## 2024-05-14 DIAGNOSIS — I10 Essential (primary) hypertension: Secondary | ICD-10-CM | POA: Diagnosis not present

## 2024-05-14 DIAGNOSIS — H526 Other disorders of refraction: Secondary | ICD-10-CM | POA: Diagnosis not present

## 2024-05-17 DIAGNOSIS — M25411 Effusion, right shoulder: Secondary | ICD-10-CM | POA: Diagnosis not present

## 2024-05-17 DIAGNOSIS — S46011A Strain of muscle(s) and tendon(s) of the rotator cuff of right shoulder, initial encounter: Secondary | ICD-10-CM | POA: Diagnosis not present

## 2024-05-17 DIAGNOSIS — M19011 Primary osteoarthritis, right shoulder: Secondary | ICD-10-CM | POA: Diagnosis not present

## 2024-05-17 DIAGNOSIS — M7581 Other shoulder lesions, right shoulder: Secondary | ICD-10-CM | POA: Diagnosis not present

## 2024-05-21 DIAGNOSIS — R053 Chronic cough: Secondary | ICD-10-CM | POA: Diagnosis not present

## 2024-05-21 DIAGNOSIS — H6121 Impacted cerumen, right ear: Secondary | ICD-10-CM | POA: Diagnosis not present

## 2024-05-21 DIAGNOSIS — Z1339 Encounter for screening examination for other mental health and behavioral disorders: Secondary | ICD-10-CM | POA: Diagnosis not present

## 2024-05-21 DIAGNOSIS — E785 Hyperlipidemia, unspecified: Secondary | ICD-10-CM | POA: Diagnosis not present

## 2024-05-21 DIAGNOSIS — Z1331 Encounter for screening for depression: Secondary | ICD-10-CM | POA: Diagnosis not present

## 2024-05-21 DIAGNOSIS — Z0000 Encounter for general adult medical examination without abnormal findings: Secondary | ICD-10-CM | POA: Diagnosis not present

## 2024-05-21 DIAGNOSIS — I10 Essential (primary) hypertension: Secondary | ICD-10-CM | POA: Diagnosis not present

## 2024-05-21 DIAGNOSIS — I351 Nonrheumatic aortic (valve) insufficiency: Secondary | ICD-10-CM | POA: Diagnosis not present

## 2024-05-21 DIAGNOSIS — F419 Anxiety disorder, unspecified: Secondary | ICD-10-CM | POA: Diagnosis not present
